# Patient Record
Sex: Female | Born: 1980 | Race: Black or African American | Hispanic: No | Smoking: Never smoker
Health system: Southern US, Community
[De-identification: ages and names within clinical notes are randomized; demographics above are authoritative.]

## PROBLEM LIST (undated history)

## (undated) DIAGNOSIS — J309 Allergic rhinitis, unspecified: Secondary | ICD-10-CM

## (undated) DIAGNOSIS — E539 Vitamin B deficiency, unspecified: Secondary | ICD-10-CM

## (undated) DIAGNOSIS — T7840XA Allergy, unspecified, initial encounter: Secondary | ICD-10-CM

## (undated) DIAGNOSIS — N938 Other specified abnormal uterine and vaginal bleeding: Secondary | ICD-10-CM

## (undated) DIAGNOSIS — R0683 Snoring: Secondary | ICD-10-CM

## (undated) DIAGNOSIS — E119 Type 2 diabetes mellitus without complications: Secondary | ICD-10-CM

## (undated) DIAGNOSIS — M519 Unspecified thoracic, thoracolumbar and lumbosacral intervertebral disc disorder: Secondary | ICD-10-CM

## (undated) DIAGNOSIS — I10 Essential (primary) hypertension: Secondary | ICD-10-CM

## (undated) DIAGNOSIS — K219 Gastro-esophageal reflux disease without esophagitis: Secondary | ICD-10-CM

## (undated) DIAGNOSIS — K649 Unspecified hemorrhoids: Secondary | ICD-10-CM

## (undated) DIAGNOSIS — N920 Excessive and frequent menstruation with regular cycle: Secondary | ICD-10-CM

## (undated) DIAGNOSIS — G629 Polyneuropathy, unspecified: Secondary | ICD-10-CM

## (undated) DIAGNOSIS — N750 Cyst of Bartholin's gland: Secondary | ICD-10-CM

## (undated) DIAGNOSIS — M21069 Valgus deformity, not elsewhere classified, unspecified knee: Secondary | ICD-10-CM

## (undated) DIAGNOSIS — K279 Peptic ulcer, site unspecified, unspecified as acute or chronic, without hemorrhage or perforation: Secondary | ICD-10-CM

## (undated) DIAGNOSIS — E559 Vitamin D deficiency, unspecified: Secondary | ICD-10-CM

## (undated) DIAGNOSIS — Z8659 Personal history of other mental and behavioral disorders: Secondary | ICD-10-CM

## (undated) DIAGNOSIS — R400 Somnolence: Secondary | ICD-10-CM

## (undated) HISTORY — PX: GALLBLADDER SURGERY: SHX652

## (undated) HISTORY — DX: Personal history of other mental and behavioral disorders: Z86.59

## (undated) HISTORY — DX: Allergic rhinitis, unspecified: J30.9

## (undated) HISTORY — PX: DIAGNOSTIC LAPAROSCOPY: SUR761

## (undated) HISTORY — DX: Polyneuropathy, unspecified: G62.9

## (undated) HISTORY — DX: Allergy, unspecified, initial encounter: T78.40XA

## (undated) HISTORY — DX: Type 2 diabetes mellitus without complications: E11.9

## (undated) HISTORY — DX: Unspecified hemorrhoids: K64.9

## (undated) HISTORY — DX: Somnolence: R40.0

## (undated) HISTORY — DX: Vitamin D deficiency, unspecified: E55.9

## (undated) HISTORY — DX: Vitamin B deficiency, unspecified: E53.9

## (undated) HISTORY — PX: ENDOMETRIAL BIOPSY: PRO73

## (undated) HISTORY — PX: TUBAL LIGATION: SHX77

## (undated) HISTORY — DX: Unspecified thoracic, thoracolumbar and lumbosacral intervertebral disc disorder: M51.9

## (undated) HISTORY — DX: Essential (primary) hypertension: I10

## (undated) HISTORY — PX: TONSILLECTOMY: SUR1361

## (undated) HISTORY — DX: Gastro-esophageal reflux disease without esophagitis: K21.9

## (undated) HISTORY — DX: Excessive and frequent menstruation with regular cycle: N92.0

## (undated) HISTORY — DX: Valgus deformity, not elsewhere classified, unspecified knee: M21.069

## (undated) HISTORY — DX: Peptic ulcer, site unspecified, unspecified as acute or chronic, without hemorrhage or perforation: K27.9

## (undated) HISTORY — DX: Cyst of Bartholin's gland: N75.0

## (undated) HISTORY — DX: Snoring: R06.83

## (undated) HISTORY — DX: Other specified abnormal uterine and vaginal bleeding: N93.8

## (undated) HISTORY — PX: SALPINGECTOMY: SHX328

---

## 2002-07-10 HISTORY — PX: DIAGNOSTIC LAPAROSCOPY: SUR761

## 2009-07-10 HISTORY — PX: TUBAL LIGATION: SHX77

## 2010-07-10 HISTORY — PX: OTHER SURGICAL HISTORY: SHX169

## 2014-07-10 HISTORY — PX: GALLBLADDER SURGERY: SHX652

## 2014-07-10 HISTORY — PX: TONSILLECTOMY: SUR1361

## 2017-10-18 DIAGNOSIS — K649 Unspecified hemorrhoids: Secondary | ICD-10-CM | POA: Insufficient documentation

## 2017-12-12 DIAGNOSIS — K219 Gastro-esophageal reflux disease without esophagitis: Secondary | ICD-10-CM | POA: Insufficient documentation

## 2017-12-12 DIAGNOSIS — R7989 Other specified abnormal findings of blood chemistry: Secondary | ICD-10-CM | POA: Insufficient documentation

## 2017-12-12 DIAGNOSIS — I10 Essential (primary) hypertension: Secondary | ICD-10-CM | POA: Insufficient documentation

## 2019-07-03 DIAGNOSIS — N750 Cyst of Bartholin's gland: Secondary | ICD-10-CM | POA: Insufficient documentation

## 2020-03-25 DIAGNOSIS — H5213 Myopia, bilateral: Secondary | ICD-10-CM | POA: Insufficient documentation

## 2021-06-27 ENCOUNTER — Ambulatory Visit: Payer: Self-pay | Admitting: Nurse Practitioner

## 2021-07-18 ENCOUNTER — Ambulatory Visit (INDEPENDENT_AMBULATORY_CARE_PROVIDER_SITE_OTHER): Payer: Medicaid Other | Admitting: Nurse Practitioner

## 2021-07-18 ENCOUNTER — Encounter: Payer: Self-pay | Admitting: Nurse Practitioner

## 2021-07-18 ENCOUNTER — Other Ambulatory Visit: Payer: Self-pay

## 2021-07-18 VITALS — BP 121/73 | HR 80 | Temp 98.0°F | Ht 65.0 in | Wt 246.4 lb

## 2021-07-18 DIAGNOSIS — E1142 Type 2 diabetes mellitus with diabetic polyneuropathy: Secondary | ICD-10-CM

## 2021-07-18 DIAGNOSIS — K279 Peptic ulcer, site unspecified, unspecified as acute or chronic, without hemorrhage or perforation: Secondary | ICD-10-CM

## 2021-07-18 DIAGNOSIS — F439 Reaction to severe stress, unspecified: Secondary | ICD-10-CM

## 2021-07-18 DIAGNOSIS — Z7689 Persons encountering health services in other specified circumstances: Secondary | ICD-10-CM | POA: Diagnosis not present

## 2021-07-18 DIAGNOSIS — Z6841 Body Mass Index (BMI) 40.0 and over, adult: Secondary | ICD-10-CM

## 2021-07-18 MED ORDER — PANTOPRAZOLE SODIUM 40 MG PO TBEC: 40.0000 mg | DELAYED_RELEASE_TABLET | Freq: Every day | ORAL | 1 refills | Status: DC

## 2021-07-18 NOTE — Progress Notes (Signed)
New Patient Office Visit  Subjective:  Patient ID: Allison Rose, female    DOB: 02/26/1981  Age: 41 y.o. MRN: [id number]  CC:  Chief Complaint  Patient presents with   New Patient (Initial Visit)    HPI Allison Rose presents to establish new primary care provider. She recently moved to the area from [place]. Move was work related.  She has history of hypertension. She is no longer taking her blood pressure medication and blood pressure is well controlled.  She takes victoza and pantoprazole and another medication for gastric ulcer.  She states that her neuropathy is getting worse. She feels like this is diabetic related. She feels like the last HgbA1c was around 6.4. she does not check her blood sugar. She was taking gabapentin at one point. Believes she was taking 600mg  daily. Stopped taking it over the summer sometime. She states that if neuropathy happens, starting in her head, will take her speech away and it will last for hours. If it starts In her feet, she will fall and have loss of balance and this will last for hours. Has seen neurologist for this in the past. Had nerve conduction study done.  She is due to have routine, fasting labs.  States that she does have difficulty sleeping. Most recent medication she was on, was hydroxyzine. States that this did help her fall asleep, but still gets up multiple times each night to urinate. States that this has been a problem for several years.  The patient states that she has been eating less recently, but gaining weight.  Recently had tyroid biopsy due to "multiple" nodules on the thyroid.  Last CPE was doe in June. Will need to get medical records from previous primary care provider.   History reviewed. No pertinent past medical history.  Past Surgical History:  Procedure Laterality Date   GALLBLADDER SURGERY N/A 2016   TONSILLECTOMY N/A 2016   TUBAL LIGATION N/A 2011   tubial ligation N/A 2012    Family History  Problem Relation Age  of Onset   Alcoholism Mother    Depression Mother    Depression Father    Alcoholism Father    Depression Maternal Grandmother    Cancer Maternal Grandmother    Diabetes Maternal Grandmother    Diabetes Maternal Grandfather    Depression Maternal Grandfather    Cancer Maternal Grandfather     Social History   Socioeconomic History   Marital status: Legally Separated    Spouse name: Not on file   Number of children: Not on file   Years of education: Not on file   Highest education level: Not on file  Occupational History   Not on file  Tobacco Use   Smoking status: Never   Smokeless tobacco: Never  Substance and Sexual Activity   Alcohol use: Yes   Drug use: Yes   Sexual activity: Not Currently  Other Topics Concern   Not on file  Social History Narrative   Not on file   Social Determinants of Health   Financial Resource Strain: Not on file  Food Insecurity: Not on file  Transportation Needs: Not on file  Physical Activity: Not on file  Stress: Not on file  Social Connections: Not on file  Intimate Partner Violence: Not on file    ROS Review of Systems  Constitutional:  Positive for fatigue. Negative for activity change, appetite change, chills and fever.  HENT:  Negative for congestion, postnasal drip, rhinorrhea, sinus pressure, sinus pain,  sneezing and sore throat.   Eyes: Negative.   Respiratory:  Negative for cough, chest tightness, shortness of breath and wheezing.   Cardiovascular:  Negative for chest pain and palpitations.  Gastrointestinal:  Negative for abdominal pain, constipation, diarrhea, nausea and vomiting.  Endocrine: Negative for cold intolerance, heat intolerance, polydipsia and polyuria.       Unsure how blood sugars are doing. Has historically been well managed.   Genitourinary:  Negative for dyspareunia, dysuria, flank pain, frequency and urgency.  Musculoskeletal:  Positive for arthralgias and myalgias. Negative for back pain.  Skin:   Negative for rash.  Allergic/Immunologic: Negative for environmental allergies.  Neurological:  Positive for numbness. Negative for dizziness, weakness and headaches.       Neuropathy in the lower legs and feet.   Hematological:  Negative for adenopathy.  Psychiatric/Behavioral:  The patient is nervous/anxious. The patient is not hyperactive.    Objective:   Today's Vitals   07/18/21 1445  BP: 121/73  Pulse: 80  Temp: 98 F (36.7 C)  SpO2: 98%  Weight: 246 lb 6.4 oz (111.8 kg)  Height: 5\' 5"  (1.651 m)   Body mass index is 41 kg/m.   Physical Exam Vitals and nursing note reviewed.  Constitutional:      Appearance: Normal appearance. She is well-developed.  HENT:     Head: Normocephalic and atraumatic.     Nose: Nose normal.     Mouth/Throat:     Mouth: Mucous membranes are moist.     Pharynx: Oropharynx is clear. No oropharyngeal exudate.  Eyes:     Conjunctiva/sclera: Conjunctivae normal.     Pupils: Pupils are equal, round, and reactive to light.  Cardiovascular:     Rate and Rhythm: Normal rate and regular rhythm.     Pulses: Normal pulses.     Heart sounds: Normal heart sounds.  Pulmonary:     Effort: Pulmonary effort is normal.     Breath sounds: Normal breath sounds.  Abdominal:     Palpations: Abdomen is soft.  Musculoskeletal:        General: Normal range of motion.     Cervical back: Normal range of motion and neck supple.  Lymphadenopathy:     Cervical: No cervical adenopathy.  Skin:    General: Skin is warm and dry.     Capillary Refill: Capillary refill takes less than 2 seconds.  Neurological:     General: No focal deficit present.     Mental Status: She is alert and oriented to person, place, and time.  Psychiatric:        Mood and Affect: Mood normal.        Behavior: Behavior normal.        Thought Content: Thought content normal.        Judgment: Judgment normal.    Assessment & Plan:  1. Encounter to establish care Appointment today to  establish new primary care provider. Will get medical records from previous providers to review and update patient chart.   2. Type 2 diabetes mellitus with polyneuropathy (HCC) Continue victoza as prescribed. Will check glucose and HgbA1c prior to next visit. Will adjust as indicated   3. Peptic ulcer Continue protonix as prescribed.  - pantoprazole (PROTONIX) 40 MG tablet; Take 1 tablet (40 mg total) by mouth daily.  Dispense: 90 tablet; Refill: 1  4. Situational stress Refer for counseling services.  - Ambulatory referral to Psychology  5. Body mass index (BMI) of 40.1-44.9 in adult [facility])  Discussed lowering calorie intake to 1500 calories per day and incorporating exercise into daily routine to help lose weight.    Problem List Items Addressed This Visit       Digestive   Peptic ulcer   Relevant Medications   pantoprazole (PROTONIX) 40 MG tablet     Endocrine   Type 2 diabetes mellitus with polyneuropathy (HCC)   Relevant Medications   liraglutide (VICTOZA) 18 MG/3ML SOPN     Other   Situational stress   Relevant Orders   Ambulatory referral to Psychology   Body mass index (BMI) of 40.1-44.9 in adult [facility])   Other Visit Diagnoses     Encounter to establish care    -  Primary       Outpatient Encounter Medications as of 07/18/2021  Medication Sig   liraglutide (VICTOZA) 18 MG/3ML SOPN Inject into the skin.   [DISCONTINUED] pantoprazole (PROTONIX) 40 MG tablet Take 1 tablet by mouth daily.   pantoprazole (PROTONIX) 40 MG tablet Take 1 tablet (40 mg total) by mouth daily.   No facility-administered encounter medications on file as of 07/18/2021.    Follow-up: Return in about 2 weeks (around 08/01/2021) for DM - next monsay, needs FBW with TSH and Free T4, b12, vitamin d, HgbA1c - see below.   Ronnell Freshwater, NP

## 2021-07-24 DIAGNOSIS — F439 Reaction to severe stress, unspecified: Secondary | ICD-10-CM | POA: Insufficient documentation

## 2021-07-24 DIAGNOSIS — E1142 Type 2 diabetes mellitus with diabetic polyneuropathy: Secondary | ICD-10-CM | POA: Insufficient documentation

## 2021-07-24 DIAGNOSIS — K279 Peptic ulcer, site unspecified, unspecified as acute or chronic, without hemorrhage or perforation: Secondary | ICD-10-CM | POA: Insufficient documentation

## 2021-07-24 DIAGNOSIS — Z6841 Body Mass Index (BMI) 40.0 and over, adult: Secondary | ICD-10-CM | POA: Insufficient documentation

## 2021-07-28 ENCOUNTER — Other Ambulatory Visit: Payer: Medicaid Other

## 2021-07-28 ENCOUNTER — Other Ambulatory Visit: Payer: Self-pay

## 2021-07-28 DIAGNOSIS — E539 Vitamin B deficiency, unspecified: Secondary | ICD-10-CM

## 2021-07-28 DIAGNOSIS — R5383 Other fatigue: Secondary | ICD-10-CM

## 2021-07-28 DIAGNOSIS — E569 Vitamin deficiency, unspecified: Secondary | ICD-10-CM

## 2021-07-28 DIAGNOSIS — Z0000 Encounter for general adult medical examination without abnormal findings: Secondary | ICD-10-CM

## 2021-07-29 LAB — CBC
Hematocrit: 38.4 % (ref 34.0–46.6)
Hemoglobin: 12.7 g/dL (ref 11.1–15.9)
MCH: 24.8 pg — ABNORMAL LOW (ref 26.6–33.0)
MCHC: 33.1 g/dL (ref 31.5–35.7)
MCV: 75 fL — ABNORMAL LOW (ref 79–97)
Platelets: 308 10*3/uL (ref 150–450)
RBC: 5.13 x10E6/uL (ref 3.77–5.28)
RDW: 15.3 % (ref 11.7–15.4)
WBC: 8 10*3/uL (ref 3.4–10.8)

## 2021-07-29 LAB — T4, FREE: Free T4: 1.18 ng/dL (ref 0.82–1.77)

## 2021-07-29 LAB — COMPREHENSIVE METABOLIC PANEL
ALT: 20 IU/L (ref 0–32)
AST: 22 IU/L (ref 0–40)
Albumin/Globulin Ratio: 1.5 (ref 1.2–2.2)
Albumin: 4 g/dL (ref 3.8–4.8)
Alkaline Phosphatase: 69 IU/L (ref 44–121)
BUN/Creatinine Ratio: 9 (ref 9–23)
BUN: 8 mg/dL (ref 6–20)
Bilirubin Total: 0.3 mg/dL (ref 0.0–1.2)
CO2: 22 mmol/L (ref 20–29)
Calcium: 9.2 mg/dL (ref 8.7–10.2)
Chloride: 106 mmol/L (ref 96–106)
Creatinine, Ser: 0.87 mg/dL (ref 0.57–1.00)
Globulin, Total: 2.7 g/dL (ref 1.5–4.5)
Glucose: 95 mg/dL (ref 70–99)
Potassium: 4.2 mmol/L (ref 3.5–5.2)
Sodium: 142 mmol/L (ref 134–144)
Total Protein: 6.7 g/dL (ref 6.0–8.5)
eGFR: 87 mL/min/{1.73_m2} (ref >59)

## 2021-07-29 LAB — LIPID PANEL
Chol/HDL Ratio: 3.3 ratio (ref 0.0–4.4)
Cholesterol, Total: 156 mg/dL (ref 100–199)
HDL: 48 mg/dL (ref >39)
LDL Chol Calc (NIH): 98 mg/dL (ref 0–99)
Triglycerides: 47 mg/dL (ref 0–149)
VLDL Cholesterol Cal: 10 mg/dL (ref 5–40)

## 2021-07-29 LAB — HEMOGLOBIN A1C
Est. average glucose Bld gHb Est-mCnc: 137 mg/dL
Hgb A1c MFr Bld: 6.4 % — ABNORMAL HIGH (ref 4.8–5.6)

## 2021-07-29 LAB — TSH: TSH: 0.938 u[IU]/mL (ref 0.450–4.500)

## 2021-07-29 LAB — VITAMIN D 25 HYDROXY (VIT D DEFICIENCY, FRACTURES): Vit D, 25-Hydroxy: 10.9 ng/mL — ABNORMAL LOW (ref 30.0–100.0)

## 2021-07-29 LAB — VITAMIN B12: Vitamin B-12: 537 pg/mL (ref 232–1245)

## 2021-08-01 ENCOUNTER — Encounter: Payer: Self-pay | Admitting: Nurse Practitioner

## 2021-08-01 ENCOUNTER — Ambulatory Visit (INDEPENDENT_AMBULATORY_CARE_PROVIDER_SITE_OTHER): Payer: Medicaid Other | Admitting: Nurse Practitioner

## 2021-08-01 ENCOUNTER — Other Ambulatory Visit: Payer: Self-pay

## 2021-08-01 VITALS — BP 117/75 | HR 79 | Temp 98.3°F | Ht 65.0 in | Wt 245.1 lb

## 2021-08-01 DIAGNOSIS — M5116 Intervertebral disc disorders with radiculopathy, lumbar region: Secondary | ICD-10-CM | POA: Diagnosis not present

## 2021-08-01 DIAGNOSIS — M21069 Valgus deformity, not elsewhere classified, unspecified knee: Secondary | ICD-10-CM | POA: Diagnosis not present

## 2021-08-01 DIAGNOSIS — E1142 Type 2 diabetes mellitus with diabetic polyneuropathy: Secondary | ICD-10-CM

## 2021-08-01 DIAGNOSIS — D485 Neoplasm of uncertain behavior of skin: Secondary | ICD-10-CM

## 2021-08-01 DIAGNOSIS — Z6841 Body Mass Index (BMI) 40.0 and over, adult: Secondary | ICD-10-CM

## 2021-08-01 DIAGNOSIS — M419 Scoliosis, unspecified: Secondary | ICD-10-CM

## 2021-08-01 DIAGNOSIS — E559 Vitamin D deficiency, unspecified: Secondary | ICD-10-CM | POA: Insufficient documentation

## 2021-08-01 DIAGNOSIS — E539 Vitamin B deficiency, unspecified: Secondary | ICD-10-CM

## 2021-08-01 LAB — POCT GLYCOSYLATED HEMOGLOBIN (HGB A1C): Hemoglobin A1C: 6.2 % — AB (ref 4.0–5.6)

## 2021-08-01 MED ORDER — ERGOCALCIFEROL 1.25 MG (50000 UT) PO CAPS: 50000.0000 [IU] | ORAL_CAPSULE | ORAL | 5 refills | Status: DC

## 2021-08-01 NOTE — Patient Instructions (Signed)
Fat and Cholesterol Restricted Eating Plan Getting too much fat and cholesterol in your diet may cause health problems. Choosing the right foods helps keep your fat and cholesterol at normal levels. This can keep you from getting certain diseases. Your doctor may recommend an eating plan that includes: Total fat: ______% or less of total calories a day. This is ______g of fat a day. Saturated fat: ______% or less of total calories a day. This is ______g of saturated fat a day. Cholesterol: less than _________mg a day. Fiber: ______g a day. What are tips for following this plan? General tips Work with your doctor to lose weight if you need to. Avoid: Foods with added sugar. Fried foods. Foods with trans fat or partially hydrogenated oils. This includes some margarines and baked goods. If you drink alcohol: Limit how much you have to: 0-1 drink a day for women who are not pregnant. 0-2 drinks a day for men. Know how much alcohol is in a drink. In the [place] one drink equals one 12 oz bottle of beer (355 mL), one 5 oz glass of wine (148 mL), or one 1 oz glass of hard liquor (44 mL). Reading food labels Check food labels for: Trans fats. Partially hydrogenated oils. Saturated fat (g) in each serving. Cholesterol (mg) in each serving. Fiber (g) in each serving. Choose foods with healthy fats, such as: Monounsaturated fats and polyunsaturated fats. These include olive and canola oil, flaxseeds, walnuts, almonds, and seeds. Omega-3 fats. These are found in certain fish, flaxseed oil, and ground flaxseeds. Choose grain products that have whole grains. Look for the word "whole" as the first word in the ingredient list. Cooking Cook foods using low-fat methods. These include baking, boiling, grilling, and broiling. Eat more home-cooked foods. Eat at restaurants and buffets less often. Eat less fast food. Avoid cooking using saturated fats, such as butter, cream, palm oil, palm kernel oil, and  coconut oil. Meal planning  At meals, divide your plate into four equal parts: Fill one-half of your plate with vegetables, green salads, and fruit. Fill one-fourth of your plate with whole grains. Fill one-fourth of your plate with low-fat (lean) protein foods. Eat fish that is high in omega-3 fats at least two times a week. This includes mackerel, tuna, sardines, and salmon. Eat foods that are high in fiber, such as whole grains, beans, apples, pears, berries, broccoli, carrots, peas, and barley. What foods should I eat? Fruits All fresh, canned (in natural juice), or frozen fruits. Vegetables Fresh or frozen vegetables (raw, steamed, roasted, or grilled). Green salads. Grains Whole grains, such as whole wheat or whole grain breads, crackers, cereals, and pasta. Unsweetened oatmeal, bulgur, barley, quinoa, or brown rice. Corn or whole wheat flour tortillas. Meats and other protein foods Ground beef (85% or leaner), grass-fed beef, or beef trimmed of fat. Skinless chicken or turkey. Ground chicken or turkey. Pork trimmed of fat. All fish and seafood. Egg whites. Dried beans, peas, or lentils. Unsalted nuts or seeds. Unsalted canned beans. Nut butters without added sugar or oil. Dairy Low-fat or nonfat dairy products, such as skim or 1% milk, 2% or reduced-fat cheeses, low-fat and fat-free ricotta or cottage cheese, or plain low-fat and nonfat yogurt. Fats and oils Tub margarine without trans fats. Light or reduced-fat mayonnaise and salad dressings. Avocado. Olive, canola, sesame, or safflower oils. The items listed above may not be a complete list of foods and beverages you can eat. Contact a dietitian for more information. What foods   should I avoid? Fruits Canned fruit in heavy syrup. Fruit in cream or butter sauce. Fried fruit. Vegetables Vegetables cooked in cheese, cream, or butter sauce. Fried vegetables. Grains White bread. White pasta. White rice. Cornbread. Bagels, pastries,  and croissants. Crackers and snack foods that contain trans fat and hydrogenated oils. Meats and other protein foods Fatty cuts of meat. Ribs, chicken wings, bacon, sausage, bologna, salami, chitterlings, fatback, hot dogs, bratwurst, and packaged lunch meats. Liver and organ meats. Whole eggs and egg yolks. Chicken and turkey with skin. Fried meat. Dairy Whole or 2% milk, cream, half-and-half, and cream cheese. Whole milk cheeses. Whole-fat or sweetened yogurt. Full-fat cheeses. Nondairy creamers and whipped toppings. Processed cheese, cheese spreads, and cheese curds. Fats and oils Butter, stick margarine, lard, shortening, ghee, or bacon fat. Coconut, palm kernel, and palm oils. Beverages Alcohol. Sugar-sweetened drinks such as sodas, lemonade, and fruit drinks. Sweets and desserts Corn syrup, sugars, honey, and molasses. Candy. Jam and jelly. Syrup. Sweetened cereals. Cookies, pies, cakes, donuts, muffins, and ice cream. The items listed above may not be a complete list of foods and beverages you should avoid. Contact a dietitian for more information. Summary Choosing the right foods helps keep your fat and cholesterol at normal levels. This can keep you from getting certain diseases. At meals, fill one-half of your plate with vegetables, green salads, and fruits. Eat high fiber foods, like whole grains, beans, apples, pears, berries, carrots, peas, and barley. Limit added sugar, saturated fats, alcohol, and fried foods. This information is not intended to replace advice given to you by your health care provider. Make sure you discuss any questions you have with your health care provider. Document Revised: 11/05/2020 Document Reviewed: 11/05/2020 Elsevier Patient Education  2022 Elsevier Inc.  

## 2021-08-01 NOTE — Progress Notes (Signed)
Established Patient Office Visit  Subjective:  Patient ID: Allison Rose, female    DOB: [birth year]  Age: 41 y.o. MRN: [id number]  CC:  Chief Complaint  Patient presents with   Diabetes    HPI Allison Rose presents for follow up visit. Recently moved from [place]. Had fasting blood work done prior to this visit. In generally, labs were good. Vitamin d was moderately low. Her HgbA1c is 6.4 today. She is supposed to be taking Victoza, but has not been taking this for some time.  -back pain. Has two areas of scoliosis in spine for which she was seeing a physical therapist and orthopedics for in [place]. She would like to have referral for those today  She is "knowck kneed." Feels as though left knee is turning in more than the right. Starting to interfere with her gait. May be causing her back to hurt more severely.  -has mole-like lesions which are growing on the inner aspects of both ankles. Are itchy. She states that when they are itchy is when they typically grow. She has noted two new ones on the puter aspec of the right foot.  History reviewed. No pertinent past medical history. -she did get referral to counseling services at her new patient visit. She states that this referral has already expired.  Past Surgical History:  Procedure Laterality Date   GALLBLADDER SURGERY N/A 2016   TONSILLECTOMY N/A 2016   TUBAL LIGATION N/A 2011   tubial ligation N/A 2012    Family History  Problem Relation Age of Onset   Alcoholism Mother    Depression Mother    Depression Father    Alcoholism Father    Depression Maternal Grandmother    Cancer Maternal Grandmother    Diabetes Maternal Grandmother    Diabetes Maternal Grandfather    Depression Maternal Grandfather    Cancer Maternal Grandfather     Social History   Socioeconomic History   Marital status: Legally Separated    Spouse name: Not on file   Number of children: Not on file   Years of education: Not on file   Highest  education level: Not on file  Occupational History   Not on file  Tobacco Use   Smoking status: Never   Smokeless tobacco: Never  Substance and Sexual Activity   Alcohol use: Yes   Drug use: Yes   Sexual activity: Not Currently  Other Topics Concern   Not on file  Social History Narrative   Not on file   Social Determinants of Health   Financial Resource Strain: Not on file  Food Insecurity: Not on file  Transportation Needs: Not on file  Physical Activity: Not on file  Stress: Not on file  Social Connections: Not on file  Intimate Partner Violence: Not on file    Outpatient Medications Prior to Visit  Medication Sig Dispense Refill   liraglutide (VICTOZA) 18 MG/3ML SOPN Inject into the skin.     pantoprazole (PROTONIX) 40 MG tablet Take 1 tablet (40 mg total) by mouth daily. 90 tablet 1   No facility-administered medications prior to visit.    Allergies  Allergen Reactions   Metformin Other (See Comments)    Stomach upset    ROS Review of Systems  Constitutional:  Negative for activity change, appetite change, chills, fatigue and fever.  HENT:  Negative for congestion, postnasal drip, rhinorrhea, sinus pressure, sinus pain, sneezing and sore throat.   Eyes: Negative.   Respiratory:  Negative for cough,  chest tightness, shortness of breath and wheezing.   Cardiovascular:  Negative for chest pain and palpitations.  Gastrointestinal:  Negative for abdominal pain, constipation, diarrhea, nausea and vomiting.  Endocrine: Negative for cold intolerance, heat intolerance, polydipsia and polyuria.       Blood sugars doing well    Genitourinary:  Negative for dyspareunia, dysuria, flank pain, frequency and urgency.  Musculoskeletal:  Positive for arthralgias, back pain and myalgias.  Skin:  Negative for rash.       Mole growth on inner ankles of both feet. Two new ones grwoing on outer aspect of right foot. Itchy and getting larger.   Allergic/Immunologic: Negative for  environmental allergies.  Neurological:  Negative for dizziness, weakness and headaches.  Hematological:  Negative for adenopathy.  Psychiatric/Behavioral:  Positive for dysphoric mood. The patient is nervous/anxious.      Objective:    Physical Exam Vitals and nursing note reviewed.  Constitutional:      Appearance: Normal appearance. She is well-developed. She is obese.  HENT:     Head: Normocephalic and atraumatic.     Nose: Nose normal.     Mouth/Throat:     Mouth: Mucous membranes are moist.  Eyes:     Extraocular Movements: Extraocular movements intact.     Conjunctiva/sclera: Conjunctivae normal.     Pupils: Pupils are equal, round, and reactive to light.  Cardiovascular:     Rate and Rhythm: Normal rate and regular rhythm.     Pulses: Normal pulses.     Heart sounds: Normal heart sounds.  Pulmonary:     Effort: Pulmonary effort is normal.     Breath sounds: Normal breath sounds.  Abdominal:     Palpations: Abdomen is soft.  Musculoskeletal:        General: Normal range of motion.     Cervical back: Normal range of motion and neck supple.       Feet:     Comments: The patient has moderate back pain.  This is worse with bending and twisting at the waist.  Standing up from a seated position causes increased pain. There is inward rotation of both knees.  Right knee is more severe than left.  Lymphadenopathy:     Cervical: No cervical adenopathy.  Skin:    General: Skin is warm and dry.     Capillary Refill: Capillary refill takes less than 2 seconds.  Neurological:     General: No focal deficit present.     Mental Status: She is alert and oriented to person, place, and time.  Psychiatric:        Mood and Affect: Mood normal.        Behavior: Behavior normal.        Thought Content: Thought content normal.        Judgment: Judgment normal.    Today's Vitals   08/01/21 0927  BP: 117/75  Pulse: 79  Temp: 98.3 F (36.8 C)  SpO2: 100%  Weight: 245 lb 1.9 oz  (111.2 kg)  Height: $Remove'5\' 5"'pBUvpWE$  (1.651 m)   Body mass index is 40.79 kg/m.    Wt Readings from Last 3 Encounters:  08/01/21 245 lb 1.9 oz (111.2 kg)  07/18/21 246 lb 6.4 oz (111.8 kg)     Health Maintenance Due  Topic Date Due   FOOT EXAM  Never done   OPHTHALMOLOGY EXAM  Never done   URINE MICROALBUMIN  Never done   HIV Screening  Never done   Hepatitis C Screening  Never done   TETANUS/TDAP  Never done   PAP SMEAR-Modifier  Never done   COVID-19 Vaccine (2 - [facility] risk series) 10/18/2019   INFLUENZA VACCINE  02/07/2021    There are no preventive care reminders to display for this patient.  Lab Results  Component Value Date   TSH 0.938 07/28/2021   Lab Results  Component Value Date   WBC 8.0 07/28/2021   HGB 12.7 07/28/2021   HCT 38.4 07/28/2021   MCV 75 (L) 07/28/2021   PLT 308 07/28/2021   Lab Results  Component Value Date   NA 142 07/28/2021   K 4.2 07/28/2021   CO2 22 07/28/2021   GLUCOSE 95 07/28/2021   BUN 8 07/28/2021   CREATININE 0.87 07/28/2021   BILITOT 0.3 07/28/2021   ALKPHOS 69 07/28/2021   AST 22 07/28/2021   ALT 20 07/28/2021   PROT 6.7 07/28/2021   ALBUMIN 4.0 07/28/2021   CALCIUM 9.2 07/28/2021   EGFR 87 07/28/2021   Lab Results  Component Value Date   CHOL 156 07/28/2021   Lab Results  Component Value Date   HDL 48 07/28/2021   Lab Results  Component Value Date   LDLCALC 98 07/28/2021   Lab Results  Component Value Date   TRIG 47 07/28/2021   Lab Results  Component Value Date   CHOLHDL 3.3 07/28/2021   Lab Results  Component Value Date   HGBA1C 6.2 (A) 08/01/2021      Assessment & Plan:  1. Type 2 diabetes mellitus with polyneuropathy (HCC) Hemoglobin A1c is 6.2 today.  Patient should continue to monitor blood sugars on a daily basis.  Blood sugar should be fasting.  The goal is to have been between 70 and 100. - POCT glycosylated hemoglobin (Hb A1C)  2. Lumbar disc disease with radiculopathy Patient with moderate  back pain, mostly in the.  It does radiate down both legs.  Will refer to orthopedics for further evaluation. - Ambulatory referral to Orthopedic Surgery  3. Scoliosis of lumbosacral spine, unspecified scoliosis type She states she has history of scoliosis in 2 separate areas of the spine.  Physical therapy has helped in the past.  Will refer to orthopedics and to physical therapy for further evaluation. - Ambulatory referral to Orthopedic Surgery - Ambulatory referral to Physical Therapy  4. Knock knee, unspecified laterality Patient with irritation of both knees, worse on the right than the left.  Refer to orthopedics for further evaluation. - Ambulatory referral to Orthopedic Surgery  5. 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  Refer to podiatry for further evaluation. - Ambulatory referral to Podiatry  6. Vitamin D deficiency Treat with Drisdol 50,000 international units once weekly for next 6 months. - ergocalciferol (DRISDOL) 1.25 MG (50000 UT) capsule; Take 1 capsule (50,000 Units total) by mouth once a week.  Dispense: 4 capsule; Refill: 5  7. Body mass index (BMI) of 40.1-44.9 in adult [facility]) Discussed lowering calorie intake to 1500 calories per day and incorporating exercise into daily routine to help lose weight.    Problem List Items Addressed This Visit       Endocrine   Type 2 diabetes mellitus with polyneuropathy ([place]) - Primary   Relevant Orders   POCT glycosylated hemoglobin (Hb A1C) (Completed)     Nervous and Auditory   Lumbar disc disease with radiculopathy   Relevant Orders   Ambulatory referral to Orthopedic Surgery     Musculoskeletal  and Integument   Scoliosis of lumbosacral spine   Relevant Orders   Ambulatory referral to Orthopedic Surgery   Ambulatory referral to Physical Therapy   Neoplasm of uncertain behavior of skin of foot   Relevant Orders   Ambulatory referral to Podiatry     Other    Body mass index (BMI) of 40.1-44.9 in adult [facility])   Knock knee   Relevant Orders   Ambulatory referral to Orthopedic Surgery   Vitamin D deficiency   Vitamin B deficiency   Relevant Medications   ergocalciferol (DRISDOL) 1.25 MG (50000 UT) capsule    Meds ordered this encounter  Medications   ergocalciferol (DRISDOL) 1.25 MG (50000 UT) capsule    Sig: Take 1 capsule (50,000 Units total) by mouth once a week.    Dispense:  4 capsule    Refill:  5    Order Specific Question:   Supervising Provider    Answer:   Beatrice Lecher D [[id number]    Follow-up: Return in about 3 months (around 10/30/2021) for diabetes with HgbA1c check.    Ronnell Freshwater, NP  This note was dictated using Systems analyst. Rapid proofreading was performed to expedite the delivery of the information. Despite proofreading, phonetic errors will occur which are common with this voice recognition software. Please take this into consideration. If there are any concerns, please contact our office.

## 2021-08-01 NOTE — Progress Notes (Signed)
Reviewed with patient during visit. Started on drisdol

## 2021-08-06 ENCOUNTER — Encounter: Payer: Self-pay | Admitting: Emergency Medicine

## 2021-08-06 ENCOUNTER — Other Ambulatory Visit: Payer: Self-pay

## 2021-08-06 ENCOUNTER — Ambulatory Visit
Admission: EM | Admit: 2021-08-06 | Discharge: 2021-08-06 | Disposition: A | Discharge status: Home / Self Care | Payer: No Typology Code available for payment source | Attending: Emergency Medicine | Admitting: Emergency Medicine

## 2021-08-06 DIAGNOSIS — S8010XA Contusion of unspecified lower leg, initial encounter: Secondary | ICD-10-CM

## 2021-08-06 DIAGNOSIS — S80811A Abrasion, right lower leg, initial encounter: Secondary | ICD-10-CM | POA: Diagnosis not present

## 2021-08-06 DIAGNOSIS — Z23 Encounter for immunization: Secondary | ICD-10-CM | POA: Diagnosis not present

## 2021-08-06 MED ORDER — MUPIROCIN 2 % EX OINT: 1.0000 "application " | TOPICAL_OINTMENT | Freq: Two times a day (BID) | CUTANEOUS | 0 refills | Status: DC

## 2021-08-06 MED ORDER — TETANUS-DIPHTH-ACELL PERTUSSIS 5-2.5-18.5 LF-MCG/0.5 IM SUSY
0.5000 mL | PREFILLED_SYRINGE | Freq: Once | INTRAMUSCULAR | Status: AC
  Administered 2021-08-06: 0.5 mL via INTRAMUSCULAR

## 2021-08-06 NOTE — ED Provider Notes (Signed)
[name]    CSN: [id number] Arrival date & time: 08/06/21  9767      History   Chief Complaint Chief Complaint  Patient presents with   Fall   Leg Pain    HPI Allison Rose is a 41 y.o. female.  Patient presents with bruises on both of her anterior lower legs and abrasion on her right anterior lower leg since yesterday.  She was carrying 2 boxes at work yesterday; tripped and fell over a cart.  No head injury or loss of consciousness.  No other injuries. No numbness, weakness, paresthesias, wound drainage, fever, or other symptoms.  No treatment at home. Her medical history includes diabetes, polyneuropathy, peptic ulcer, lumbar disc disease with radiculopathy, scoliosis.   The history is provided by the patient and medical records.   History reviewed. No pertinent past medical history.  Patient Active Problem List   Diagnosis Date Noted   Lumbar disc disease with radiculopathy 08/01/2021   Scoliosis of lumbosacral spine 08/01/2021   Knock knee 08/01/2021   Neoplasm of uncertain behavior of skin of foot 08/01/2021   Vitamin D deficiency 08/01/2021   Vitamin B deficiency 08/01/2021   Type 2 diabetes mellitus with polyneuropathy ([place]) 07/24/2021   Peptic ulcer 07/24/2021   Situational stress 07/24/2021   Body mass index (BMI) of 40.1-44.9 in adult [facility]) 07/24/2021    Past Surgical History:  Procedure Laterality Date   GALLBLADDER SURGERY N/A 2016   TONSILLECTOMY N/A 2016   TUBAL LIGATION N/A 2011   tubial ligation N/A 2012    OB History   No obstetric history on file.      Home Medications    Prior to Admission medications   Medication Sig Start Date End Date Taking? Authorizing Provider  mupirocin ointment (BACTROBAN) 2 % Apply 1 application topically 2 (two) times daily. 08/06/21  Yes Sharion Balloon, NP  ergocalciferol (DRISDOL) 1.25 MG (50000 UT) capsule Take 1 capsule (50,000 Units total) by mouth once a week. 08/01/21   Ronnell Freshwater, NP   liraglutide (VICTOZA) 18 MG/3ML SOPN Inject into the skin.    [provider]  pantoprazole (PROTONIX) 40 MG tablet Take 1 tablet (40 mg total) by mouth daily. 07/18/21   Ronnell Freshwater, NP    Family History Family History  Problem Relation Age of Onset   Alcoholism Mother    Depression Mother    Depression Father    Alcoholism Father    Depression Maternal Grandmother    Cancer Maternal Grandmother    Diabetes Maternal Grandmother    Diabetes Maternal Grandfather    Depression Maternal Grandfather    Cancer Maternal Grandfather     Social History Social History   Tobacco Use   Smoking status: Never   Smokeless tobacco: Never  Substance Use Topics   Alcohol use: Yes   Drug use: Yes     Allergies   Metformin   Review of Systems Review of Systems  Constitutional:  Negative for chills and fever.  Respiratory:  Negative for cough and shortness of breath.   Cardiovascular:  Negative for chest pain and palpitations.  Gastrointestinal:  Negative for abdominal pain.  Musculoskeletal:  Negative for arthralgias, back pain, gait problem and joint swelling.  Skin:  Positive for color change and wound.  Neurological:  Negative for weakness and numbness.  All other systems reviewed and are negative.   Physical Exam Triage Vital Signs ED Triage Vitals  Enc Vitals Group  BP 08/06/21 1001 (!) 150/87     Pulse Rate 08/06/21 1001 89     Resp 08/06/21 1001 18     Temp 08/06/21 1001 98.6 F (37 C)     Temp src --      SpO2 08/06/21 1001 97 %     Weight --      Height --      Head Circumference --      Peak Flow --      Pain Score 08/06/21 1014 4     Pain Loc --      Pain Edu? --      Excl. in [place]? --    No data found.  Updated Vital Signs BP (!) 150/87 (BP Location: Left Arm)    Pulse 89    Temp 98.6 F (37 C)    Resp 18    LMP 07/16/2021    SpO2 97%   Visual Acuity Right Eye Distance:   Left Eye Distance:   Bilateral Distance:    Right Eye  Near:   Left Eye Near:    Bilateral Near:     Physical Exam Vitals and nursing note reviewed.  Constitutional:      General: She is not in acute distress.    Appearance: She is well-developed. She is not ill-appearing.  HENT:     Mouth/Throat:     Mouth: Mucous membranes are moist.  Cardiovascular:     Rate and Rhythm: Normal rate and regular rhythm.     Heart sounds: Normal heart sounds.  Pulmonary:     Effort: Pulmonary effort is normal. No respiratory distress.     Breath sounds: Normal breath sounds.  Musculoskeletal:        General: Tenderness present. No swelling or deformity. Normal range of motion.     Cervical back: Neck supple.       Legs:  Skin:    General: Skin is warm and dry.     Capillary Refill: Capillary refill takes less than 2 seconds.     Findings: Bruising and lesion present. No erythema.  Neurological:     General: No focal deficit present.     Mental Status: She is alert and oriented to person, place, and time.     Sensory: No sensory deficit.     Motor: No weakness.     Gait: Gait normal.  Psychiatric:        Mood and Affect: Mood normal.        Behavior: Behavior normal.     UC Treatments / Results  Labs (all labs ordered are listed, but only abnormal results are displayed) Labs Reviewed - No data to display  EKG   Radiology No results found.  Procedures Procedures (including critical care time)  Medications Ordered in UC Medications  Tdap (BOOSTRIX) injection 0.5 mL (0.5 mLs Intramuscular Given 08/06/21 1020)    Initial Impression / Assessment and Plan / UC Course  I have reviewed the triage vital signs and the nursing notes.  Pertinent labs & imaging results that were available during my care of the patient were reviewed by me and considered in my medical decision making (see chart for details).   Abrasion of right lower leg, Contusions of bilateral lower legs.  Tetanus updated.  Patient declines xrays.  Treating abrasion with  mupirocin ointment. Wound care instructions and signs of infection discussed.  Discussed care of contusions.  Instructed her to follow up with her PCP if her symptoms are not  improving.  She agrees to plan of care.    Final Clinical Impressions(s) / UC Diagnoses   Final diagnoses:  Abrasion of right lower leg, initial encounter  Contusion of lower leg, unspecified laterality, initial encounter     Discharge Instructions      Your tetanus was updated today.  Apply the mupirocin ointment as directed.  Follow up with your primary care provider if your symptoms are not improving.         ED Prescriptions     Medication Sig Dispense Auth. Provider   mupirocin ointment (BACTROBAN) 2 % Apply 1 application topically 2 (two) times daily. 22 g Sharion Balloon, NP      PDMP not reviewed this encounter.Sharion Balloon, NP 08/06/21 [phone]

## 2021-08-06 NOTE — ED Triage Notes (Signed)
Pt here for work injury that occurred yesterday. Pt had a mechanical fall while holding boxes and hurting both shins. Bruises to both legs, and a small scratch to right leg. Pt needs an updated tetanus.

## 2021-08-06 NOTE — Discharge Instructions (Addendum)
Your tetanus was updated today.  Apply the mupirocin ointment as directed.  Follow up with your primary care provider if your symptoms are not improving.

## 2021-08-07 ENCOUNTER — Encounter: Payer: Self-pay | Admitting: Emergency Medicine

## 2021-08-07 ENCOUNTER — Ambulatory Visit
Admission: EM | Admit: 2021-08-07 | Discharge: 2021-08-07 | Disposition: A | Discharge status: Home / Self Care | Payer: Medicaid Other | Attending: Emergency Medicine | Admitting: Emergency Medicine

## 2021-08-07 DIAGNOSIS — H9201 Otalgia, right ear: Secondary | ICD-10-CM

## 2021-08-07 DIAGNOSIS — J029 Acute pharyngitis, unspecified: Secondary | ICD-10-CM | POA: Diagnosis not present

## 2021-08-07 DIAGNOSIS — H6981 Other specified disorders of Eustachian tube, right ear: Secondary | ICD-10-CM

## 2021-08-07 LAB — POCT RAPID STREP A (OFFICE): Rapid Strep A Screen: NEGATIVE

## 2021-08-07 NOTE — ED Provider Notes (Signed)
[name]    CSN: [id number] Arrival date & time: 08/07/21  0907      History   Chief Complaint Chief Complaint  Patient presents with   Otalgia   Facial Pain    HPI Allison Rose is a 41 y.o. female.  Patient was seen here yesterday for a work-related lower leg injury.  She developed right ear pain and right side sore throat yesterday afternoon.  Treatment at home with ibuprofen.  No fever, chills, cough, shortness of breath, vomiting, diarrhea, or other symptoms.  Her medical history includes diabetes.    The history is provided by the patient and medical records.   History reviewed. No pertinent past medical history.  Patient Active Problem List   Diagnosis Date Noted   Lumbar disc disease with radiculopathy 08/01/2021   Scoliosis of lumbosacral spine 08/01/2021   Knock knee 08/01/2021   Neoplasm of uncertain behavior of skin of foot 08/01/2021   Vitamin D deficiency 08/01/2021   Vitamin B deficiency 08/01/2021   Type 2 diabetes mellitus with polyneuropathy ([place]) 07/24/2021   Peptic ulcer 07/24/2021   Situational stress 07/24/2021   Body mass index (BMI) of 40.1-44.9 in adult [facility]) 07/24/2021    Past Surgical History:  Procedure Laterality Date   GALLBLADDER SURGERY N/A 2016   TONSILLECTOMY N/A 2016   TUBAL LIGATION N/A 2011   tubial ligation N/A 2012    OB History   No obstetric history on file.      Home Medications    Prior to Admission medications   Medication Sig Start Date End Date Taking? Authorizing Provider  ergocalciferol (DRISDOL) 1.25 MG (50000 UT) capsule Take 1 capsule (50,000 Units total) by mouth once a week. 08/01/21   Ronnell Freshwater, NP  liraglutide (VICTOZA) 18 MG/3ML SOPN Inject into the skin.    [provider]  mupirocin ointment (BACTROBAN) 2 % Apply 1 application topically 2 (two) times daily. 08/06/21   Sharion Balloon, NP  pantoprazole (PROTONIX) 40 MG tablet Take 1 tablet (40 mg total) by mouth daily. 07/18/21    Ronnell Freshwater, NP    Family History Family History  Problem Relation Age of Onset   Alcoholism Mother    Depression Mother    Depression Father    Alcoholism Father    Depression Maternal Grandmother    Cancer Maternal Grandmother    Diabetes Maternal Grandmother    Diabetes Maternal Grandfather    Depression Maternal Grandfather    Cancer Maternal Grandfather     Social History Social History   Tobacco Use   Smoking status: Never   Smokeless tobacco: Never  Substance Use Topics   Alcohol use: Yes   Drug use: Yes     Allergies   Metformin   Review of Systems Review of Systems  Constitutional:  Negative for chills and fever.  HENT:  Positive for ear pain and sore throat.   Respiratory:  Negative for cough and shortness of breath.   Cardiovascular:  Negative for chest pain and palpitations.  Gastrointestinal:  Negative for diarrhea and vomiting.  Skin:  Negative for color change and rash.  All other systems reviewed and are negative.   Physical Exam Triage Vital Signs ED Triage Vitals [08/07/21 0912]  Enc Vitals Group     BP      Pulse      Resp      Temp      Temp src      SpO2  Weight      Height      Head Circumference      Peak Flow      Pain Score 5     Pain Loc      Pain Edu?      Excl. in [place]?    No data found.  Updated Vital Signs BP 132/85 (BP Location: Left Arm)    Pulse 94    Temp 98.2 F (36.8 C) (Oral)    Resp 18    LMP 07/16/2021    SpO2 98%   Visual Acuity Right Eye Distance:   Left Eye Distance:   Bilateral Distance:    Right Eye Near:   Left Eye Near:    Bilateral Near:     Physical Exam Vitals and nursing note reviewed.  Constitutional:      General: She is not in acute distress.    Appearance: She is well-developed. She is not ill-appearing.  HENT:     Right Ear: Tympanic membrane and ear canal normal.     Left Ear: Tympanic membrane and ear canal normal.     Nose: Nose normal.     Mouth/Throat:      Mouth: Mucous membranes are moist.     Pharynx: Posterior oropharyngeal erythema present.  Cardiovascular:     Rate and Rhythm: Normal rate and regular rhythm.     Heart sounds: Normal heart sounds.  Pulmonary:     Effort: Pulmonary effort is normal. No respiratory distress.     Breath sounds: Normal breath sounds.  Musculoskeletal:     Cervical back: Neck supple.  Skin:    General: Skin is warm and dry.  Neurological:     Mental Status: She is alert.  Psychiatric:        Mood and Affect: Mood normal.        Behavior: Behavior normal.     UC Treatments / Results  Labs (all labs ordered are listed, but only abnormal results are displayed) Labs Reviewed  COVID-19, FLU A+B NAA  POCT RAPID STREP A (OFFICE)    EKG   Radiology No results found.  Procedures Procedures (including critical care time)  Medications Ordered in UC Medications - No data to display  Initial Impression / Assessment and Plan / UC Course  I have reviewed the triage vital signs and the nursing notes.  Pertinent labs & imaging results that were available during my care of the patient were reviewed by me and considered in my medical decision making (see chart for details).    Sore throat, right ear otalgia and eustachian tube dysfunction.  Rapid strep negative.  COVID and flu pending.  Instructed patient to self quarantine per CDC guidelines.  Discussed symptomatic treatment including plain Mucinex, ibuprofen, rest, hydration.  Instructed patient to follow up with PCP if symptoms are not improving.  Patient agrees to plan of care.   Final Clinical Impressions(s) / UC Diagnoses   Final diagnoses:  Sore throat  Otalgia of right ear  Dysfunction of right eustachian tube     Discharge Instructions      Your rapid strep test is negative.  Your COVID and Flu tests are pending.  You should self quarantine until the test results are back.    Take plain Mucinex and ibuprofen as directed.      Follow-up with your primary care provider if your symptoms are not improving.         ED Prescriptions   None  PDMP not reviewed this encounter.   Sharion Balloon, NP 08/07/21 [phone]

## 2021-08-07 NOTE — ED Triage Notes (Signed)
Pt here with right ear pain and right facial/jaw pain x 1 day.

## 2021-08-07 NOTE — Discharge Instructions (Addendum)
Your rapid strep test is negative.  Your COVID and Flu tests are pending.  You should self quarantine until the test results are back.    Take plain Mucinex and ibuprofen as directed.     Follow-up with your primary care provider if your symptoms are not improving.

## 2021-08-08 ENCOUNTER — Ambulatory Visit (HOSPITAL_BASED_OUTPATIENT_CLINIC_OR_DEPARTMENT_OTHER)
Admission: RE | Admit: 2021-08-08 | Discharge: 2021-08-08 | Disposition: A | Discharge status: Home / Self Care | Payer: Medicaid Other | Source: Ambulatory Visit | Attending: Orthopaedic Surgery | Admitting: Orthopaedic Surgery

## 2021-08-08 ENCOUNTER — Other Ambulatory Visit: Payer: Self-pay

## 2021-08-08 ENCOUNTER — Ambulatory Visit (INDEPENDENT_AMBULATORY_CARE_PROVIDER_SITE_OTHER): Payer: Medicaid Other | Admitting: Orthopaedic Surgery

## 2021-08-08 ENCOUNTER — Other Ambulatory Visit (HOSPITAL_BASED_OUTPATIENT_CLINIC_OR_DEPARTMENT_OTHER): Payer: Self-pay | Admitting: Orthopaedic Surgery

## 2021-08-08 DIAGNOSIS — G8929 Other chronic pain: Secondary | ICD-10-CM | POA: Diagnosis present

## 2021-08-08 DIAGNOSIS — M545 Low back pain, unspecified: Secondary | ICD-10-CM | POA: Diagnosis present

## 2021-08-08 DIAGNOSIS — M25561 Pain in right knee: Secondary | ICD-10-CM | POA: Insufficient documentation

## 2021-08-08 LAB — COVID-19, FLU A+B NAA
Influenza A, NAA: NOT DETECTED
Influenza B, NAA: NOT DETECTED
SARS-CoV-2, NAA: NOT DETECTED

## 2021-08-08 NOTE — Progress Notes (Signed)
Chief Complaint: Low back pain and right knee pain     History of Present Illness:    Allison Rose is a 41 y.o. female presents with low back pain and right knee pain which has been going on for several years.  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  She was previously very active in [place] although has been less active at this time.  She does have a history of diabetic neuropathy.  She states that she has gained some weight since moved to [place] which she is happy with.  She is also not been able to do her exercises for her back.  She says that previously working on her back strengthening and swimming did not make her back feel dramatically better.  She has tried bracing as well as ergonomic adjustments for the truck.  She takes Tylenol and ibuprofen as needed for the back.  She denies any specific knee injury.  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  She has been wearing an over-the-counter brace for the right knee but this is only somewhat helpful.  She is never had injections or physical therapy for the right knee.    Surgical History:   None  PMH/PSH/Family History/Social History/Meds/Allergies:   No past medical history on file. Past Surgical History:  Procedure Laterality Date   GALLBLADDER SURGERY N/A 2016   TONSILLECTOMY N/A 2016   TUBAL LIGATION N/A 2011   tubial ligation N/A 2012   Social History   Socioeconomic History   Marital status: Legally Separated    Spouse name: Not on file   Number of children: Not on file   Years of education: Not on file   Highest education level: Not on file  Occupational History   Not on file  Tobacco Use   Smoking status: Never   Smokeless tobacco: Never  Substance and Sexual Activity   Alcohol use: Yes   Drug use: Yes   Sexual activity: Not Currently  Other Topics Concern   Not on file  Social History Narrative   Not on file    Social Determinants of Health   Financial Resource Strain: Not on file  Food Insecurity: Not on file  Transportation Needs: Not on file  Physical Activity: Not on file  Stress: Not on file  Social Connections: Not on file   Family History  Problem Relation Age of Onset   Alcoholism Mother    Depression Mother    Depression Father    Alcoholism Father    Depression Maternal Grandmother    Cancer Maternal Grandmother    Diabetes Maternal Grandmother    Diabetes Maternal Grandfather    Depression Maternal Grandfather    Cancer Maternal Grandfather    Allergies  Allergen Reactions   Metformin Other (See Comments)    Stomach upset   Current Outpatient Medications  Medication Sig Dispense Refill   ergocalciferol (DRISDOL) 1.25 MG (50000 UT) capsule Take 1 capsule (50,000 Units total) by mouth once a week. 4 capsule 5   liraglutide (VICTOZA) 18 MG/3ML SOPN Inject into the skin.     mupirocin ointment (BACTROBAN) 2 % Apply 1 application topically 2 (two) times daily. 22 g 0   pantoprazole (PROTONIX) 40 MG tablet Take 1  tablet (40 mg total) by mouth daily. 90 tablet 1   No current facility-administered medications for this visit.   No results found.  Review of Systems:   A ROS was performed including pertinent positives and negatives as documented in the HPI.  Physical Exam :   Constitutional: NAD and appears stated age Neurological: Alert and oriented Psych: Appropriate affect and cooperative Last menstrual period 07/16/2021.   Comprehensive Musculoskeletal Exam:    Tenderness to palpation about the midline back.  There is no radicular radiating type pain.  No evidence of neurogenic claudication.  She has worsening midline back pain with forward flexion.    Musculoskeletal Exam  Gait Normal  Alignment Valgus bilateral   Right Left  Inspection Normal Normal  Palpation    Tenderness Lateral joint None  Crepitus None None  Effusion None None  Range of Motion     Extension 0 0  Flexion 130 130  Strength    Extension 5/5 5/5  Flexion 5/5 5/5  Ligament Exam     Generalized Laxity No No  Lachman Negative Negative   Pivot Shift Negative Negative  Anterior Drawer Negative Negative  Valgus at 0 Negative Negative  Valgus at 20 Negative Negative  Varus at 0 0 0  Varus at 20   0 0  Posterior Drawer at 90 0 0  Vascular/Lymphatic Exam    Edema None None  Venous Stasis Changes No No  Distal Circulation Normal Normal  Neurologic    Light Touch Sensation Intact Intact  Special Tests:      Imaging:   Xray (scoliosis): There is only a mild thoracic scoliosis curve which is measuring approximately less than 10 degrees   I personally reviewed and interpreted the radiographs.   Assessment:   Very pleasant 41 year old female with mid lower back pain consistent with degenerative disc disease.  I did counsel her specifically on ergonomic adjustments and bracing.  We did discuss a core strengthening program that she will participate in here and I do believe she would be a good candidate for aquatic therapy.  With regard to the right knee she does have lateral joint tenderness in the setting of a significant valgus deformity.  At this time I would like to obtain x-rays of the right knee so that we can assess for any type of lateral compartment osteoarthritis particular in the setting of her valgus deformity.  She will begin physical therapy for the core as well as the right knee and return to clinic in 1 month for reassessment.  I do believe she would also benefit from an osteoarthritis hinged type brace which is lower profile for the right knee  Plan :    -Return to clinic in 1 month for reassessment     I personally saw and evaluated the patient, and participated in the management and treatment plan.  Vanetta Mulders, MD Attending Physician, Orthopedic Surgery  This document was dictated using Dragon voice recognition software. A reasonable attempt  at proof reading has been made to minimize errors.

## 2021-08-15 ENCOUNTER — Ambulatory Visit: Payer: Medicaid Other | Admitting: Podiatry

## 2021-08-15 ENCOUNTER — Other Ambulatory Visit: Payer: Self-pay

## 2021-08-15 ENCOUNTER — Encounter: Payer: Self-pay | Admitting: Podiatry

## 2021-08-15 VITALS — BP 115/63 | HR 75 | Temp 97.7°F | Resp 16

## 2021-08-15 DIAGNOSIS — L439 Lichen planus, unspecified: Secondary | ICD-10-CM | POA: Diagnosis not present

## 2021-08-15 MED ORDER — CLOBETASOL PROPIONATE 0.05 % EX OINT: 1.0000 "application " | TOPICAL_OINTMENT | Freq: Every day | CUTANEOUS | 2 refills | Status: AC

## 2021-08-15 NOTE — Progress Notes (Signed)
°  Subjective:  Patient ID: Allison Rose, female    DOB: 01-17-1981,  MRN: [id number]  Chief Complaint  Patient presents with   Tinea Pedis    "I think I'm coming in for these moles on my feet."    40 y.o. female presents with the above complaint. History confirmed with patient.  She has multiple plaque-like itching lesions that have developed and gradually worsened over the last 2 years.  She also has some on her hands occasionally.  Objective:  Physical Exam: warm, good capillary refill, no trophic changes or ulcerative lesions, normal DP and PT pulses, normal sensory exam, and small slightly tender pruritic plaques medial ankle bilateral and on the inside of the left wrist.    Assessment:   1. Lichen planus      Plan:  Patient was evaluated and treated and all questions answered.  Discussed etiology and treatment options of lichen planus with her.  I discussed treatment with topical corticosteroids which I recommended and sent her a prescription for Temovate ointment.  Advised use once daily for 3 to 4 weeks if not improving then can transition to twice daily.  If does not improve this would recommend biopsy of the lesions.  She will return to see me in 2 months or as needed  No follow-ups on file.

## 2021-09-05 ENCOUNTER — Encounter (HOSPITAL_BASED_OUTPATIENT_CLINIC_OR_DEPARTMENT_OTHER): Payer: Self-pay | Admitting: Physical Therapy

## 2021-09-05 ENCOUNTER — Ambulatory Visit (HOSPITAL_BASED_OUTPATIENT_CLINIC_OR_DEPARTMENT_OTHER): Payer: Medicaid Other | Attending: Orthopaedic Surgery | Admitting: Physical Therapy

## 2021-09-05 ENCOUNTER — Encounter (HOSPITAL_BASED_OUTPATIENT_CLINIC_OR_DEPARTMENT_OTHER): Payer: Self-pay

## 2021-09-05 ENCOUNTER — Ambulatory Visit (HOSPITAL_BASED_OUTPATIENT_CLINIC_OR_DEPARTMENT_OTHER): Payer: Medicaid Other | Admitting: Orthopaedic Surgery

## 2021-09-05 ENCOUNTER — Other Ambulatory Visit: Payer: Self-pay

## 2021-09-05 DIAGNOSIS — G8929 Other chronic pain: Secondary | ICD-10-CM | POA: Insufficient documentation

## 2021-09-05 DIAGNOSIS — M25561 Pain in right knee: Secondary | ICD-10-CM | POA: Diagnosis not present

## 2021-09-05 DIAGNOSIS — M545 Low back pain, unspecified: Secondary | ICD-10-CM | POA: Diagnosis not present

## 2021-09-05 NOTE — Therapy (Signed)
OUTPATIENT PHYSICAL THERAPY THORACOLUMBAR EVALUATION   Patient Name: Allison Rose MRN: [id number] DOB:[birth year], 41 y.o., female Today's Date: 09/06/2021   PT End of Session - 09/05/21 1435     Visit Number 1    Number of Visits 17    Date for PT Re-Evaluation 11/04/21    Authorization Type [employer] MCD    Authorization - Visit Number 27    PT Start Time 1430    PT Stop Time 1510    PT Time Calculation (min) 40 min    Activity Tolerance Patient tolerated treatment well    Behavior During Therapy [facility] for tasks assessed/performed             History reviewed. No pertinent past medical history. Past Surgical History:  Procedure Laterality Date   GALLBLADDER SURGERY N/A 2016   TONSILLECTOMY N/A 2016   TUBAL LIGATION N/A 2011   tubial ligation N/A 2012   Patient Active Problem List   Diagnosis Date Noted   Lumbar disc disease with radiculopathy 08/01/2021   Scoliosis of lumbosacral spine 08/01/2021   Knock knee 08/01/2021   Neoplasm of uncertain behavior of skin of foot 08/01/2021   Vitamin D deficiency 08/01/2021   Vitamin B deficiency 08/01/2021   Type 2 diabetes mellitus with polyneuropathy ([place]) 07/24/2021   Peptic ulcer 07/24/2021   Situational stress 07/24/2021   Body mass index (BMI) of 40.1-44.9 in adult ([place]) 07/24/2021    PCP: Ronnell Freshwater, NP  REFERRING PROVIDER: Vanetta Mulders, MD  REFERRING DIAG: LBP, Right knee pain  THERAPY DIAG:  Chronic low back pain, unspecified back pain laterality, unspecified whether sciatica present  Chronic pain of right knee  ONSET DATE: years ago for back, recent for knee  SUBJECTIVE:                                                                                                                                                                                           SUBJECTIVE STATEMENT: It started years ago. 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 Knee started very recently- I feel the pain mostly on the outside and feel like it is drifting in. Diagnosed with scoliosis about 2 yr ago.  PERTINENT HISTORY:  DMt2  PAIN:  Are you having pain? Yes NPRS scale:  LBP- 8/10, sharp/stabbing and into hips Rt knee- 6/10  Aggravating factors: walking hurts knee Relieving factors: laying prone over elevated foot on bed  PRECAUTIONS: None  WEIGHT BEARING RESTRICTIONS No  FALLS:  Has patient  fallen in last 6 months? 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  PLOF: Independent  PATIENT GOALS  decrease pain, better stretching/proper form, maintain at home   OBJECTIVE:   DIAGNOSTIC FINDINGS:  Pt reports arthritis found on xray  PATIENT SURVEYS:  Modified ODI 17/50   COGNITION:  Overall cognitive status: Within functional limits for tasks assessed     SENSATION:  N/T in feet and hands on and off  MUSCLE LENGTH: Grossly limited  POSTURE:  Incr lumbar lordosis  PALPATION: TTP bil SIJ- more on Left  LUMBARAROM/PROM  A/PROM A/PROM  09/06/2021  Flexion   Extension   Right lateral flexion   Left lateral flexion   Right rotation   Left rotation    (Blank rows = not tested)  LE AROM/PROM:  A/PROM Right 09/06/2021 Left 09/06/2021  Hip flexion    Hip extension    Hip abduction    Hip adduction    Hip internal rotation    Hip external rotation    Knee flexion    Knee extension    Ankle dorsiflexion    Ankle plantarflexion    Ankle inversion    Ankle eversion     (Blank rows = not tested)  LE MMT:  MMT Right 09/06/2021 Left 09/06/2021  Hip flexion    Hip extension    Hip abduction 43.3 47.3  Hip adduction    Hip internal rotation    Hip external rotation    Knee flexion    Knee extension    Ankle dorsiflexion     Ankle plantarflexion    Ankle inversion    Ankle eversion     (Blank rows = not tested)    GAIT: Rt genu valgus leading to collapse of longitudinal arch of foot    TODAY'S TREATMENT  EVAL: Post pelvic tilt, also added march Active HS stretch 2-layer lift to Lt shoe   PATIENT EDUCATION:  Education details: Geophysicist/field seismologist of condition, POC, HEP, exercise form/rationale, aquatics Person educated: Patient Education method: Consulting civil engineer, Media planner, Corporate treasurer cues, Verbal cues, and Handouts Education comprehension: verbalized understanding, returned demonstration, verbal cues required, tactile cues required, and needs further education   HOME EXERCISE PROGRAM: [id number]  ASSESSMENT:  CLINICAL IMPRESSION: Patient is a 41 y.o. F who was seen today for physical therapy evaluation and treatment for acute on Chronic LBP and acute knee pain.    OBJECTIVE IMPAIRMENTS decreased activity tolerance, decreased endurance, difficulty walking, decreased ROM, decreased strength, increased muscle spasms, impaired flexibility, improper body mechanics, postural dysfunction, obesity, and pain.   ACTIVITY LIMITATIONS cleaning, community activity, driving, and occupation.   PERSONAL FACTORS 3+ comorbidities: scoliosis, polyneuropathy, BMI  are also affecting patient's functional outcome.    REHAB POTENTIAL: Good  CLINICAL DECISION MAKING: Stable/uncomplicated  EVALUATION COMPLEXITY: Low   GOALS: Goals reviewed with patient? Yes  SHORT TERM GOALS:  STG Name Target Date Goal status  1 Able to demo proper abdominal contraction through exercises Baseline:  began educating at eval 09/29/21 INITIAL  2 Pt will demo proper hip hinge Baseline: requires education 09/29/21 INITIAL  3 Will establish if heel lift is appropriate to continue Baseline: will monitor 09/29/21 INITIAL   LONG TERM GOALS:   LTG Name Target Date Goal status  1 Able to tolerate necessary positions for work with knowledge of  maintaining low pain levels Baseline: will educate and  progress 11/04/21 INITIAL  2 Pt will be independent in long term HEP for lumbopelvic stability and gross flexibility Baseline: will progress as appropriate 11/04/21 INITIAL  3 Able to navigate stairs without increased pain Baseline: pain at eval, lives in 2nd floor apartment 11/04/21 INITIAL  4 Pt will improve ODI score by MDC Baseline: see documentation 11/04/21 INITIAL                  PLAN: PT FREQUENCY: 1-2x/week  PT DURATION: 8 weeks  PLANNED INTERVENTIONS: Therapeutic exercises, Therapeutic activity, Neuromuscular re-education, Balance training, Gait training, Patient/Family education, Joint manipulation, Joint mobilization, Stair training, Aquatic Therapy, Dry Needling, Electrical stimulation, Spinal mobilization, Cryotherapy, Moist heat, Taping, Traction, Manual therapy, and Neuro Muscular re-education  PLAN FOR NEXT SESSION: manual therapy PRN, progress core strength   Mccayla Shimada C. Yetta Marceaux PT, DPT 09/06/21 8:41 PM  Check all possible CPT codes: 23300- Therapeutic Exercise, [phone]- Neuro Re-education, [phone] - Gait Training, 97140 - Manual Therapy, 97530 - Therapeutic Activities, 97535 - Self Care, [phone] - Mechanical traction, 97014 - Electrical stimulation (unattended), and [id number] - Aquatic therapy

## 2021-09-06 ENCOUNTER — Encounter (HOSPITAL_BASED_OUTPATIENT_CLINIC_OR_DEPARTMENT_OTHER): Payer: Self-pay | Admitting: Physical Therapy

## 2021-09-11 NOTE — Therapy (Signed)
?OUTPATIENT PHYSICAL THERAPY TREATMENT NOTE ? ? ?Patient Name: Allison Rose ?MRN: [id number] ?DOB:14-Mar-1981, 41 y.o., female ?Today's Date: 09/12/2021 ? ?PCP: Ronnell Freshwater, NP ?REFERRING PROVIDER: Ronnell Freshwater, NP ? ? PT End of Session - 09/12/21 0804   ? ? Visit Number 2   ? Number of Visits 17   ? Date for PT Re-Evaluation 11/04/21   ? Authorization Type [employer] MCD   ? Authorization - Visit Number 27   ? PT Start Time 0801   ? PT Stop Time 0840   ? PT Time Calculation (min) 39 min   ? Activity Tolerance Patient tolerated treatment well   ? Behavior During Therapy [facility] for tasks assessed/performed   ? ?  ?  ? ?  ? ? ?History reviewed. No pertinent past medical history. ?Past Surgical History:  ?Procedure Laterality Date  ? GALLBLADDER SURGERY N/A 2016  ? TONSILLECTOMY N/A 2016  ? TUBAL LIGATION N/A 2011  ? tubial ligation N/A 2012  ? ?Patient Active Problem List  ? Diagnosis Date Noted  ? Lumbar disc disease with radiculopathy 08/01/2021  ? Scoliosis of lumbosacral spine 08/01/2021  ? Knock knee 08/01/2021  ? Neoplasm of uncertain behavior of skin of foot 08/01/2021  ? Vitamin D deficiency 08/01/2021  ? Vitamin B deficiency 08/01/2021  ? Type 2 diabetes mellitus with polyneuropathy ([place]) 07/24/2021  ? Peptic ulcer 07/24/2021  ? Situational stress 07/24/2021  ? Body mass index (BMI) of 40.1-44.9 in adult [facility]) 07/24/2021  ? ?PCP: Ronnell Freshwater, NP ?  ?REFERRING PROVIDER: Vanetta Mulders, MD ?  ?REFERRING DIAG: LBP, Right knee pain ?  ?THERAPY DIAG:  ?Chronic low back pain, unspecified back pain laterality, unspecified whether sciatica present ?  ?Chronic pain of right knee ?  ?ONSET DATE: years ago for back, recent for knee ?  ?SUBJECTIVE:                                                                                                                                                                                          ?  ?SUBJECTIVE STATEMENT: ?Pt reports she can tell a huge difference since having lift in  shoe.  Plans to buy more for other shoes. Her pain is much improved.  ? ? ?PERTINENT HISTORY:  ?DMt2 ?  ?PAIN:  ?Are you having pain? Yes ? ?LBP- 7/10, sharp/stabbing and into hips ?Rt knee- 6/10 ?  ?Aggravating factors: walking hurts knee ?Relieving factors: laying prone over elevated foot on bed ?  ?PRECAUTIONS: None ?  ?PATIENT GOALS  decrease pain, better stretching/proper form, maintain at home ?  ?  ?OBJECTIVE:  ?  ?DIAGNOSTIC FINDINGS:  ?  Pt reports arthritis found on xray ?  ?PATIENT SURVEYS:  ?Modified ODI 17/50 ?  ?  ?COGNITION: ?         Overall cognitive status: Within functional limits for tasks assessed              ?          ?SENSATION: ?         N/T in feet and hands on and off ?  ?MUSCLE LENGTH: ?Grossly limited ?  ?POSTURE:  ?Incr lumbar lordosis ?  ?PALPATION: ?TTP bil SIJ- more on Left ?  ?LUMBARAROM/PROM ?  ?A/PROM A/PROM  ?09/06/2021  ?Flexion    ?Extension    ?Right lateral flexion    ?Left lateral flexion    ?Right rotation    ?Left rotation    ? (Blank rows = not tested) ?  ?LE AROM/PROM: ?  ?A/PROM Right ?09/06/2021 Left ?09/06/2021  ?Hip flexion      ?Hip extension      ?Hip abduction      ?Hip adduction      ?Hip internal rotation      ?Hip external rotation      ?Knee flexion      ?Knee extension      ?Ankle dorsiflexion      ?Ankle plantarflexion      ?Ankle inversion      ?Ankle eversion      ? (Blank rows = not tested) ?  ?LE MMT: ?  ?MMT Right ?09/06/2021 Left ?09/06/2021  ?Hip flexion      ?Hip extension      ?Hip abduction 43.3 47.3  ?Hip adduction      ?Hip internal rotation      ?Hip external rotation      ?Knee flexion      ?Knee extension      ?Ankle dorsiflexion      ?Ankle plantarflexion      ?Ankle inversion      ?Ankle eversion      ? (Blank rows = not tested) ?  ?  ?  ?GAIT: ?Rt genu valgus leading to collapse of longitudinal arch of foot ?  ?  ?  ?TODAY'S TREATMENT  ? ?Pt seen for aquatic therapy today.  Treatment took place in water 3.25-4 ft in depth at the [facility] pool. Temp of water was 97?[place] Kitchen  Pt entered/exited the pool via stairs independently with single rail. ? ?Warm up: ?Forward and backward walking.  Forward march. ?Side stepping with arm abdct/add   ?Holding onto wall: hip ext x 8 each leg, cues to decrease height of kick. ?Holding onto Yellow noodle: forward trunk flexion for back stretch x 10 sec, repeated 5 x, repeated to sides x 3 each.  ?    Backward walking, mini squats x 10.  Ab set with gentle noodle press down x 5 sec x 3 (switched to blue noodle for this exercise); Hip abdct with knee flexion x 10 each leg, alternating. Hip flexion/ knee flexion crossing midline x 10 (relieves back) ?Holding onto blue noodle: Ab set with gentle noodle press down x 5 sec x 3 ?Wide stance with trunk rotation, hand claps, 5 each direction. ?Supine float with nekdoodle, yellow noodle under arms, blue noodle under legs x 4 min for decompression.  ? ?Pt requires buoyancy for support and to offload joints with strengthening exercises. Viscosity of the water is needed for resistance of strengthening; water current perturbations provides challenge to standing balance unsupported, requiring  increased core activation. ? ?EVAL: ?Post pelvic tilt, also added march ?Active HS stretch ?2-layer lift to Lt shoe ?  ?  ?PATIENT EDUCATION:  ?Education details: exercise form/rationale, aquatics intro ?Person educated: Patient ?Education method: Explanation, Demonstration, Tactile cues, Verbal cues,  ?Education comprehension: verbalized understanding, returned demonstration, verbal cues required, tactile cues required, and needs further education ?  ?  ?HOME EXERCISE PROGRAM: ?[id number] ?  ?ASSESSMENT: ?  ?CLINICAL IMPRESSION: ? ?Retro gait decreased back pain, otherwise back pain remained at 7/10.  Rt knee pain decreased to 3/10 with exercises.  Rotational exercises were tolerated well. She is confident and independent in the aquatic setting. Minor cues for relaxing knees in stance  position. Supine float relieved all pain. Goals are ongoing.  ?  ?  ?OBJECTIVE IMPAIRMENTS decreased activity tolerance, decreased endurance, difficulty walking, decreased ROM, decreased strength, increased muscle spasms, impaired flexibility, improper body mechanics, postural dysfunction, obesity, and pain.  ?   ?  ?GOALS: ?Goals reviewed with patient? Yes ?  ?SHORT TERM GOALS: ?  ?STG Name Target Date Goal status  ?1 Able to demo proper abdominal contraction through exercises ?Baseline:  began educating at eval 09/29/21 INITIAL  ?2 Pt will demo proper hip hinge ?Baseline: requires education 09/29/21 INITIAL  ?3 Will establish if heel lift is appropriate to continue ?Baseline: will monitor 09/29/21 INITIAL  ?  ?LONG TERM GOALS:  ?  ?LTG Name Target Date Goal status  ?1 Able to tolerate necessary positions for work with knowledge of maintaining low pain levels ?Baseline: will educate and progress 11/04/21 INITIAL  ?2 Pt will be independent in long term HEP for lumbopelvic stability and gross flexibility ?Baseline: will progress as appropriate 11/04/21 INITIAL  ?3 Able to navigate stairs without increased pain ?Baseline: pain at eval, lives in 2nd floor apartment 11/04/21 INITIAL  ?4 Pt will improve ODI score by MDC ?Baseline: see documentation 11/04/21 INITIAL  ?         ?         ?         ?  ?PLAN: ?PT FREQUENCY: 1-2x/week ?  ?PT DURATION: 8 weeks ?  ?PLANNED INTERVENTIONS: Therapeutic exercises, Therapeutic activity, Neuromuscular re-education, Balance training, Gait training, Patient/Family education, Joint manipulation, Joint mobilization, Stair training, Aquatic Therapy, Dry Needling, Electrical stimulation, Spinal mobilization, Cryotherapy, Moist heat, Taping, Traction, Manual therapy, and Neuro Muscular re-education ?  ?PLAN FOR NEXT SESSION: manual therapy PRN, progress core strength.  Assess response to aquatic therapy. ? ?Kerin Perna, PTA ?09/12/21 8:49 AM ? ?

## 2021-09-12 ENCOUNTER — Ambulatory Visit (HOSPITAL_BASED_OUTPATIENT_CLINIC_OR_DEPARTMENT_OTHER): Payer: Medicaid Other | Attending: Orthopaedic Surgery | Admitting: Physical Therapy

## 2021-09-12 ENCOUNTER — Other Ambulatory Visit: Payer: Self-pay

## 2021-09-12 ENCOUNTER — Encounter (HOSPITAL_BASED_OUTPATIENT_CLINIC_OR_DEPARTMENT_OTHER): Payer: Self-pay | Admitting: Physical Therapy

## 2021-09-12 DIAGNOSIS — M25561 Pain in right knee: Secondary | ICD-10-CM | POA: Diagnosis present

## 2021-09-12 DIAGNOSIS — M545 Low back pain, unspecified: Secondary | ICD-10-CM | POA: Diagnosis not present

## 2021-09-12 DIAGNOSIS — G8929 Other chronic pain: Secondary | ICD-10-CM | POA: Insufficient documentation

## 2021-09-18 NOTE — Therapy (Signed)
?OUTPATIENT PHYSICAL THERAPY TREATMENT NOTE ? ? ?Patient Name: Allison Rose ?MRN: [id number] ?DOB:01/31/1981, 41 y.o., female ?Today's Date: 09/19/2021 ? ?PCP: Ronnell Freshwater, NP ?REFERRING PROVIDER: Ronnell Freshwater, NP ? ? PT End of Session - 09/19/21 0810   ? ? Visit Number 3   ? Number of Visits 17   ? Date for PT Re-Evaluation 11/04/21   ? Authorization Type [employer] MCD   ? Authorization - Visit Number 27   ? PT Start Time 0802   ? PT Stop Time 0845   ? PT Time Calculation (min) 43 min   ? Activity Tolerance Patient tolerated treatment well   ? Behavior During Therapy [facility] for tasks assessed/performed   ? ?  ?  ? ?  ? ? ? ?History reviewed. No pertinent past medical history. ?Past Surgical History:  ?Procedure Laterality Date  ? GALLBLADDER SURGERY N/A 2016  ? TONSILLECTOMY N/A 2016  ? TUBAL LIGATION N/A 2011  ? tubial ligation N/A 2012  ? ?Patient Active Problem List  ? Diagnosis Date Noted  ? Lumbar disc disease with radiculopathy 08/01/2021  ? Scoliosis of lumbosacral spine 08/01/2021  ? Knock knee 08/01/2021  ? Neoplasm of uncertain behavior of skin of foot 08/01/2021  ? Vitamin D deficiency 08/01/2021  ? Vitamin B deficiency 08/01/2021  ? Type 2 diabetes mellitus with polyneuropathy ([place]) 07/24/2021  ? Peptic ulcer 07/24/2021  ? Situational stress 07/24/2021  ? Body mass index (BMI) of 40.1-44.9 in adult [facility]) 07/24/2021  ? ?PCP: Ronnell Freshwater, NP ?  ?REFERRING PROVIDER: Vanetta Mulders, MD ?  ?REFERRING DIAG: LBP, Right knee pain ?  ?THERAPY DIAG:  ?Chronic low back pain, unspecified back pain laterality, unspecified whether sciatica present ?  ?Chronic pain of right knee ?  ?ONSET DATE: years ago for back, recent for knee ?  ?SUBJECTIVE:                                                                                                                                                                                          ?  ?SUBJECTIVE STATEMENT: ?Pt reports she was so sore after last session.  She felt  great when she got out of the water.  Pain hit me when I tried to get out of truck after session, 20 out of 10 pain. She reports she was sore for a few days afterwards.  Had to use cane at work. ? ?PERTINENT HISTORY:  ?DMt2 ?  ?PAIN:  ?Are you having pain? 8/10 ? ?Location : LBP and Rt knee ?  ?Aggravating factors: walking hurts knee ?Relieving factors: laying prone over elevated foot on bed ?  ?  PRECAUTIONS: None ?  ?PATIENT GOALS  decrease pain, better stretching/proper form, maintain at home ?  ?  ?OBJECTIVE:  ?  ?DIAGNOSTIC FINDINGS:  ?Pt reports arthritis found on xray ?  ?PATIENT SURVEYS:  ?Modified ODI 17/50 ?  ?  ?COGNITION: ?         Overall cognitive status: Within functional limits for tasks assessed              ?          ?SENSATION: ?         N/T in feet and hands on and off ?  ?MUSCLE LENGTH: ?Grossly limited ?  ?POSTURE:  ?Incr lumbar lordosis ?  ?PALPATION: ?TTP bil SIJ- more on Left ?  ?LUMBARAROM/PROM ?  ?A/PROM A/PROM  ?09/06/2021  ?Flexion    ?Extension    ?Right lateral flexion    ?Left lateral flexion    ?Right rotation    ?Left rotation    ? (Blank rows = not tested) ?  ?LE AROM/PROM: ?  ?A/PROM Right ?09/06/2021 Left ?09/06/2021  ?Hip flexion      ?Hip extension      ?Hip abduction      ?Hip adduction      ?Hip internal rotation      ?Hip external rotation      ?Knee flexion      ?Knee extension      ?Ankle dorsiflexion      ?Ankle plantarflexion      ?Ankle inversion      ?Ankle eversion      ? (Blank rows = not tested) ?  ?LE MMT: ?  ?MMT Right ?09/06/2021 Left ?09/06/2021  ?Hip flexion      ?Hip extension      ?Hip abduction 43.3 47.3  ?Hip adduction      ?Hip internal rotation      ?Hip external rotation      ?Knee flexion      ?Knee extension      ?Ankle dorsiflexion      ?Ankle plantarflexion      ?Ankle inversion      ?Ankle eversion      ? (Blank rows = not tested) ?  ?  ?  ?GAIT: ?Rt genu valgus leading to collapse of longitudinal arch of foot ?  ?  ?  ?TODAY'S TREATMENT  ? ?Pt seen for  aquatic therapy today.  Treatment took place in water 3.25-4 ft in depth at the [facility] pool. Temp of water was 96?.  Pt entered/exited the pool via stairs independently with single rail. ? ?Warm up: ?Forward and backward walking (without device)   ?Side stepping  ?Ab set with shoulder ext to neutral x 10 with blue noodle ?Holding onto wall: hip ext x 10 each leg, 2 sets each leg ?Squats holding wall x 20  ?Holding onto Yellow noodle: forward trunk flexion for back stretch x 10 sec, repeated 3 x, repeated to sides x 2 each.  ?Backward walking  ?Hip flexion/ knee flexion crossing midline x 10 (relieves back); repeated into abdct x 10 each  ?Sit to stand from water bench x 10 with core engaged (blue step stool to stand on) ?Single KTC x 10 sec x 2 each leg ?Scap squeeze x 2, shoulder rolls x 10 ?Seated fig 4 x 15 sec x 2 reps each leg ?Supine float with nekdoodle, yellow noodle under arms, blue noodle under legs x 5 min for decompression.  ? ?Pt requires buoyancy for  support and to offload joints with strengthening exercises. Viscosity of the water is needed for resistance of strengthening; water current perturbations provides challenge to standing balance unsupported, requiring increased core activation. ? ?EVAL: ?Post pelvic tilt, also added march ?Active HS stretch ?2-layer lift to Lt shoe ?  ?  ?PATIENT EDUCATION:  ?Education details: exercise form/rationale, aquatics intro ?Person educated: Patient ?Education method: Explanation, Demonstration, Tactile cues, Verbal cues,  ?Education comprehension: verbalized understanding, returned demonstration, verbal cues required, tactile cues required, and needs further education ?  ?  ?HOME EXERCISE PROGRAM: ?[id number] ?  ?ASSESSMENT: ?  ?CLINICAL IMPRESSION: ?Pt guarded with exercise today due to increased pain after last session. With time, pt was able to relax more in the water.  ?Rt knee pain decreased to 6/10 during exercise, low back pain remained 8/10. Pt  reported relief with fig 4 stretch and knee to chest.  Supine float relieved all pain. Address seated posture and stretches for work day. Goals are ongoing.  ?  ?  ?OBJECTIVE IMPAIRMENTS decreased activity tolerance, decreased endurance, difficulty walking, decreased ROM, decreased strength, increased muscle spasms, impaired flexibility, improper body mechanics, postural dysfunction, obesity, and pain.  ?   ?  ?GOALS: ?Goals reviewed with patient? Yes ?  ?SHORT TERM GOALS: ?  ?STG Name Target Date Goal status  ?1 Able to demo proper abdominal contraction through exercises ?Baseline:  began educating at eval 09/29/21 INITIAL  ?2 Pt will demo proper hip hinge ?Baseline: requires education 09/29/21 INITIAL  ?3 Will establish if heel lift is appropriate to continue ?Baseline: will monitor 09/29/21 INITIAL  ?  ?LONG TERM GOALS:  ?  ?LTG Name Target Date Goal status  ?1 Able to tolerate necessary positions for work with knowledge of maintaining low pain levels ?Baseline: will educate and progress 11/04/21 INITIAL  ?2 Pt will be independent in long term HEP for lumbopelvic stability and gross flexibility ?Baseline: will progress as appropriate 11/04/21 INITIAL  ?3 Able to navigate stairs without increased pain ?Baseline: pain at eval, lives in 2nd floor apartment 11/04/21 INITIAL  ?4 Pt will improve ODI score by MDC ?Baseline: see documentation 11/04/21 INITIAL  ?         ?         ?         ?  ?PLAN: ?PT FREQUENCY: 1-2x/week ?  ?PT DURATION: 8 weeks ?  ?PLANNED INTERVENTIONS: Therapeutic exercises, Therapeutic activity, Neuromuscular re-education, Balance training, Gait training, Patient/Family education, Joint manipulation, Joint mobilization, Stair training, Aquatic Therapy, Dry Needling, Electrical stimulation, Spinal mobilization, Cryotherapy, Moist heat, Taping, Traction, Manual therapy, and Neuro Muscular re-education ?  ?PLAN FOR NEXT SESSION: manual therapy PRN, progress core strength.  Continue aquatic  therapy. ? ?Kerin Perna, PTA ?09/19/21 9:36 AM ? ? ?

## 2021-09-19 ENCOUNTER — Encounter (HOSPITAL_BASED_OUTPATIENT_CLINIC_OR_DEPARTMENT_OTHER): Payer: Self-pay | Admitting: Physical Therapy

## 2021-09-19 ENCOUNTER — Ambulatory Visit (HOSPITAL_BASED_OUTPATIENT_CLINIC_OR_DEPARTMENT_OTHER): Payer: Medicaid Other | Admitting: Physical Therapy

## 2021-09-19 ENCOUNTER — Other Ambulatory Visit: Payer: Self-pay

## 2021-09-19 ENCOUNTER — Ambulatory Visit (HOSPITAL_BASED_OUTPATIENT_CLINIC_OR_DEPARTMENT_OTHER): Payer: Medicaid Other | Admitting: Orthopaedic Surgery

## 2021-09-19 DIAGNOSIS — M1711 Unilateral primary osteoarthritis, right knee: Secondary | ICD-10-CM

## 2021-09-19 DIAGNOSIS — M545 Low back pain, unspecified: Secondary | ICD-10-CM | POA: Diagnosis not present

## 2021-09-19 DIAGNOSIS — G8929 Other chronic pain: Secondary | ICD-10-CM

## 2021-09-19 MED ORDER — LIDOCAINE HCL 1 % IJ SOLN
4.0000 mL | INTRAMUSCULAR | Status: AC | PRN
  Administered 2021-09-19: 4 mL

## 2021-09-19 MED ORDER — TRIAMCINOLONE ACETONIDE 40 MG/ML IJ SUSP
80.0000 mg | INTRAMUSCULAR | Status: AC | PRN
  Administered 2021-09-19: 80 mg via INTRA_ARTICULAR

## 2021-09-19 NOTE — Progress Notes (Signed)
Chief Complaint: Low back pain and right knee pain     History of Present Illness:   09/19/2021: Presents today for follow-up of her right knee.  She states that after her first session she did have a significant amount of pain but this is somewhat minimal.  She is overall feeling better and stronger.  She has gotten a heel lift for the left foot as well which is dramatically helping.  Today she does work with physical therapy and having some lateral based knee pain.  Allison Rose is a 41 y.o. female presents with low back pain and right knee pain which has been going on for several years.  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  She was previously very active in [place] although has been less active at this time.  She does have a history of diabetic neuropathy.  She states that she has gained some weight since moved to [place] which she is happy with.  She is also not been able to do her exercises for her back.  She says that previously working on her back strengthening and swimming did not make her back feel dramatically better.  She has tried bracing as well as ergonomic adjustments for the truck.  She takes Tylenol and ibuprofen as needed for the back.  She denies any specific knee injury.  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  She has been wearing an over-the-counter brace for the right knee but this is only somewhat helpful.  She is never had injections or physical therapy for the right knee.    Surgical History:   None  PMH/PSH/Family History/Social History/Meds/Allergies:   No past medical history on file. Past Surgical History:  Procedure Laterality Date   GALLBLADDER SURGERY N/A 2016   TONSILLECTOMY N/A 2016   TUBAL LIGATION N/A 2011   tubial ligation N/A 2012   Social History   Socioeconomic History   Marital status: Legally Separated    Spouse name: Not on file    Number of children: Not on file   Years of education: Not on file   Highest education level: Not on file  Occupational History   Not on file  Tobacco Use   Smoking status: Never   Smokeless tobacco: Never  Substance and Sexual Activity   Alcohol use: Not Currently   Drug use: Yes    Types: Marijuana   Sexual activity: Not Currently  Other Topics Concern   Not on file  Social History Narrative   Not on file   Social Determinants of Health   Financial Resource Strain: Not on file  Food Insecurity: Not on file  Transportation Needs: Not on file  Physical Activity: Not on file  Stress: Not on file  Social Connections: Not on file   Family History  Problem Relation Age of Onset   Alcoholism Mother    Depression Mother    Depression Father    Alcoholism Father    Depression Maternal Grandmother    Cancer Maternal Grandmother    Diabetes Maternal Grandmother    Diabetes Maternal Grandfather    Depression Maternal Grandfather    Cancer Maternal Grandfather    Allergies  Allergen Reactions   Metformin Other (See Comments)  Stomach upset   Current Outpatient Medications  Medication Sig Dispense Refill   clobetasol ointment (TEMOVATE) 6.76 % Apply 1 application topically daily. 30 g 2   ergocalciferol (DRISDOL) 1.25 MG (50000 UT) capsule Take 1 capsule (50,000 Units total) by mouth once a week. (Patient not taking: Reported on 08/15/2021) 4 capsule 5   liraglutide (VICTOZA) 18 MG/3ML SOPN Inject into the skin. (Patient not taking: Reported on 08/15/2021)     mupirocin ointment (BACTROBAN) 2 % Apply 1 application topically 2 (two) times daily. (Patient not taking: Reported on 08/15/2021) 22 g 0   pantoprazole (PROTONIX) 40 MG tablet Take 1 tablet (40 mg total) by mouth daily. 90 tablet 1   No current facility-administered medications for this visit.   No results found.  Review of Systems:   A ROS was performed including pertinent positives and  negatives as documented in the HPI.  Physical Exam :   Constitutional: NAD and appears stated age Neurological: Alert and oriented Psych: Appropriate affect and cooperative There were no vitals taken for this visit.   Comprehensive Musculoskeletal Exam:    Tenderness to palpation about the midline back.  There is no radicular radiating type pain.  No evidence of neurogenic claudication.  She has worsening midline back pain with forward flexion.    Musculoskeletal Exam  Gait Normal  Alignment Valgus bilateral   Right Left  Inspection Normal Normal  Palpation    Tenderness Lateral joint None  Crepitus None None  Effusion None None  Range of Motion    Extension 0 0  Flexion 130 130  Strength    Extension 5/5 5/5  Flexion 5/5 5/5  Ligament Exam     Generalized Laxity No No  Lachman Negative Negative   Pivot Shift Negative Negative  Anterior Drawer Negative Negative  Valgus at 0 Negative Negative  Valgus at 20 Negative Negative  Varus at 0 0 0  Varus at 20   0 0  Posterior Drawer at 90 0 0  Vascular/Lymphatic Exam    Edema None None  Venous Stasis Changes No No  Distal Circulation Normal Normal  Neurologic    Light Touch Sensation Intact Intact  Special Tests:      Imaging:   Xray (scoliosis): There is only a mild thoracic scoliosis curve which is measuring approximately less than 10 degrees  X-rays standing views and right knee AP: She has significant valgus involving the right knee with weightbearing plumbline going centrally to the lateral tibial plateau  I personally reviewed and interpreted the radiographs.   Assessment:   Very pleasant 41 year old female with mid lower back pain consistent with degenerative disc disease.  I did counsel her specifically on ergonomic adjustments and bracing.  We did discuss a core strengthening program that she will participate in here and I do believe she would be a good candidate for aquatic therapy.  With regard to the  right knee she does have lateral joint tenderness in the setting of a significant valgus deformity.  X-rays do show very mild arthritic changes although a full assessment of her cartilage is not fully possible with MRI.  We did discuss other options and possibilities including a meniscal injury involving the lateral side.  That being said given her significant valgus deformity I believe that any treatment algorithm would most likely recommend some type of distal femoral osteotomy to improve her alignment.  Overall she is tolerating the knee and improving with physical therapy.  I do believe that a steroid  injection into the right knee can help her out as well.  Given the significant aspect of deformity correction that would be required, she would like to continue with conservative management and strengthening of the right knee to optimize this prior to any further discussion of MRI versus surgical management  Plan :    -Right knee ultrasound-guided injection performed today after verbal consent obtained -Return to clinic 2 months for reassessment -Continue physical therapy for strengthening of the right knee and hip   Procedure Note  Patient: Alexandre Lightsey             Date of Birth: April 18, 1981           MRN: [id number]             Visit Date: 09/19/2021  Procedures: Visit Diagnoses: No diagnosis found.  Large Joint Inj on 09/19/2021 9:39 AM Indications: pain Details: 22 G 1.5 in needle, ultrasound-guided anterior approach  Arthrogram: No  Medications: 4 mL lidocaine 1 %; 80 mg triamcinolone acetonide 40 MG/ML Outcome: tolerated well, no immediate complications Procedure, treatment alternatives, risks and benefits explained, specific risks discussed. Consent was given by the patient. Immediately prior to procedure a time out was called to verify the correct patient, procedure, equipment, support staff and site/side marked as required. Patient was prepped and draped in the usual sterile fashion.          I personally saw and evaluated the patient, and participated in the management and treatment plan.  Vanetta Mulders, MD Attending Physician, Orthopedic Surgery  This document was dictated using Dragon voice recognition software. A reasonable attempt at proof reading has been made to minimize errors.

## 2021-09-23 NOTE — Therapy (Addendum)
?OUTPATIENT PHYSICAL THERAPY TREATMENT NOTE ? ? ?Patient Name: Allison Rose ?MRN: [id number] ?DOB:05/12/1981, 41 y.o., female ?Today's Date: 09/26/2021 ? ?PCP: Ronnell Freshwater, NP ?REFERRING PROVIDER: Ronnell Freshwater, NP ? ? PT End of Session - 09/26/21 0807   ? ? Visit Number 4   ? Number of Visits 17   ? Date for PT Re-Evaluation 11/04/21   ? Authorization Type [employer] MCD   ? Authorization - Visit Number 4   ? Authorization - Number of Visits 27   ? PT Start Time [phone]   ? PT Stop Time 0840   ? PT Time Calculation (min) 42 min   ? Activity Tolerance Patient tolerated treatment well   ? Behavior During Therapy [facility] for tasks assessed/performed   ? ?  ?  ? ?  ? ? ? ? ?History reviewed. No pertinent past medical history. ?Past Surgical History:  ?Procedure Laterality Date  ? GALLBLADDER SURGERY N/A 2016  ? TONSILLECTOMY N/A 2016  ? TUBAL LIGATION N/A 2011  ? tubial ligation N/A 2012  ? ?Patient Active Problem List  ? Diagnosis Date Noted  ? Lumbar disc disease with radiculopathy 08/01/2021  ? Scoliosis of lumbosacral spine 08/01/2021  ? Knock knee 08/01/2021  ? Neoplasm of uncertain behavior of skin of foot 08/01/2021  ? Vitamin D deficiency 08/01/2021  ? Vitamin B deficiency 08/01/2021  ? Type 2 diabetes mellitus with polyneuropathy ([place]) 07/24/2021  ? Peptic ulcer 07/24/2021  ? Situational stress 07/24/2021  ? Body mass index (BMI) of 40.1-44.9 in adult [facility]) 07/24/2021  ? ?PCP: Ronnell Freshwater, NP ?  ?REFERRING PROVIDER: Vanetta Mulders, MD ?  ?REFERRING DIAG: LBP, Right knee pain ?  ?THERAPY DIAG:  ?Chronic low back pain, unspecified back pain laterality, unspecified whether sciatica present ?  ?Chronic pain of right knee ?  ?ONSET DATE: years ago for back, recent for knee ?  ?SUBJECTIVE:                                                                                                                                                                                          ?  ?SUBJECTIVE STATEMENT: ?Pt reports she had  an injection in Rt knee last Monday.  Now Lt knee is hurting worse.  She states she had some pain in chest muscles after last session, but overall she tolerated it much better.   ?PERTINENT HISTORY:  ?DM2 ?  ?PAIN:  ?Are you having pain? 6-710 ? ?Location : LBP and Lt knee ?Rt knee = 2/10 ?  ?Aggravating factors: walking hurts knee ?Relieving factors: laying prone over elevated foot on bed ?  ?PRECAUTIONS: None ?  ?  PATIENT GOALS  decrease pain, better stretching/proper form, maintain at home ?  ?  ?OBJECTIVE:  *Objective findings taken at initial Evaluation, unless otherwise noted.  ?  ?DIAGNOSTIC FINDINGS:  ?Pt reports arthritis found on xray ?  ?PATIENT SURVEYS:  ?Modified ODI 17/50 ?  ?  ?COGNITION: ?         Overall cognitive status: Within functional limits for tasks assessed              ?          ?SENSATION: ?         N/T in feet and hands on and off ?  ?MUSCLE LENGTH: ?Grossly limited ?  ?POSTURE:  ?Incr lumbar lordosis ?  ?PALPATION: ?TTP bil SIJ- more on Left ?  ?LUMBARAROM/PROM ?  ?A/PROM A/PROM  ?09/06/2021  ?Flexion    ?Extension    ?Right lateral flexion    ?Left lateral flexion    ?Right rotation    ?Left rotation    ? (Blank rows = not tested) ?  ?LE AROM/PROM: ?  ?A/PROM Right ?09/06/2021 Left ?09/06/2021  ?Hip flexion      ?Hip extension      ?Hip abduction      ?Hip adduction      ?Hip internal rotation      ?Hip external rotation      ?Knee flexion      ?Knee extension      ?Ankle dorsiflexion      ?Ankle plantarflexion      ?Ankle inversion      ?Ankle eversion      ? (Blank rows = not tested) ?  ?LE MMT: ?  ?MMT Right ?09/06/2021 Left ?09/06/2021  ?Hip flexion      ?Hip extension      ?Hip abduction 43.3 47.3  ?Hip adduction      ?Hip internal rotation      ?Hip external rotation      ?Knee flexion      ?Knee extension      ?Ankle dorsiflexion      ?Ankle plantarflexion      ?Ankle inversion      ?Ankle eversion      ? (Blank rows = not tested) ?  ?  ?  ?GAIT: ?Rt genu valgus leading to collapse of  longitudinal arch of foot ?  ?  ?  ?TODAY'S TREATMENT  ? ?09/26/21 ?Pt seen for aquatic therapy today.  Treatment took place in water 3.25-4 ft in depth at the [facility] pool. Temp of water was 96?.  Pt entered/exited the pool via stairs independently with single rail. ? ?Warm up: ?Forward and backward walking (without device)   ?Side stepping with core engaged ?Ab set kickboard push down (vertical position) 5 sec x 10; then with push / pull ?Ab set with shoulder ext to neutral x 10 with blue noodle ?Holding onto wall: hip ext x 10 each leg, Hip abdct x 10 x 2 ?Seated: Holding onto noodle: forward trunk flexion for back stretch x 10 sec, repeated 3 x, repeated to sides x 2 each.  ?Hip flexion/ knee flexion crossing midline x 10 each leg(relieves back); repeated into abdct x 10 each  ?Single KTC x 10 sec x 2 each leg ?Fig 4 stretch / piriformis x 15 sec x 3 reps each leg on water bench ?Squats holding wall x 10 , cues to allow heels to come up (guarded) ?Repeated with yellow noodle between legs x 10 ?Bicycle with  yellow dumbbells under water and yellow noodle between legs ?Supine float with nekdoodle, yellow noodle under arms, blue noodle under legs x 4 min for decompression.  ? ? * Sensitive skin rock tape applied to bilat knees in upside down horseshoe shape (lateral/med knee to tibial tuberosity) with 25% stretch to increase proprioception, decompress tissue. Safe removal instructions were given with verbalized understanding.  ? ?Pt requires buoyancy for support and to offload joints with strengthening exercises. Viscosity of the water is needed for resistance of strengthening; water current perturbations provides challenge to standing balance unsupported, requiring increased core activation. ?  ?PATIENT EDUCATION:  ?Education details: exercise form/rationale, aquatics info, ktape infor ?Person educated: Patient ?Education method: Explanation, Demonstration, Tactile cues, Verbal cues,  ?Education  comprehension: verbalized understanding, returned demonstration, verbal cues required, tactile cues required, and needs further education ?  ?  ?HOME EXERCISE PROGRAM: ?[id number] ?  ?ASSESSMENT: ?  ?CLINICAL IMPRESSION: ?Pt tolerating aquatic exercises well, with report of decrease in pain overall during session.  Introduced some new aquatic exercises with potential to add to aquatic HEP with good tolerance.  Trial of Rock tape applied to knees. She is progressing towards goals.  ?  ?  ?OBJECTIVE IMPAIRMENTS decreased activity tolerance, decreased endurance, difficulty walking, decreased ROM, decreased strength, increased muscle spasms, impaired flexibility, improper body mechanics, postural dysfunction, obesity, and pain.  ?   ?  ?GOALS: ?Goals reviewed with patient? Yes ?  ?SHORT TERM GOALS: ?  ?STG Name Target Date Goal status  ?1 Able to demo proper abdominal contraction through exercises ?Baseline:  began educating at eval 09/29/21 INITIAL  ?2 Pt will demo proper hip hinge ?Baseline: requires education 09/29/21 INITIAL  ?3 Will establish if heel lift is appropriate to continue ?Baseline: will monitor 09/29/21 Achieved  ?  ?LONG TERM GOALS:  ?  ?LTG Name Target Date Goal status  ?1 Able to tolerate necessary positions for work with knowledge of maintaining low pain levels ?Baseline: will educate and progress 11/04/21 INITIAL  ?2 Pt will be independent in long term HEP for lumbopelvic stability and gross flexibility ?Baseline: will progress as appropriate 11/04/21 INITIAL  ?3 Able to navigate stairs without increased pain ?Baseline: pain at eval, lives in 2nd floor apartment 11/04/21 INITIAL  ?4 Pt will improve ODI score by MDC ?Baseline: see documentation 11/04/21 INITIAL  ?         ?         ?         ?  ?PLAN: ?PT FREQUENCY: 1-2x/week ?  ?PT DURATION: 8 weeks ?  ?PLANNED INTERVENTIONS: Therapeutic exercises, Therapeutic activity, Neuromuscular re-education, Balance training, Gait training, Patient/Family education,  Joint manipulation, Joint mobilization, Stair training, Aquatic Therapy, Dry Needling, Electrical stimulation, Spinal mobilization, Cryotherapy, Moist heat, Taping, Traction, Manual therapy, and Neuro Muscula

## 2021-09-26 ENCOUNTER — Ambulatory Visit (HOSPITAL_BASED_OUTPATIENT_CLINIC_OR_DEPARTMENT_OTHER): Payer: Medicaid Other | Admitting: Physical Therapy

## 2021-09-26 ENCOUNTER — Encounter (HOSPITAL_BASED_OUTPATIENT_CLINIC_OR_DEPARTMENT_OTHER): Payer: Self-pay | Admitting: Physical Therapy

## 2021-09-26 ENCOUNTER — Other Ambulatory Visit: Payer: Self-pay

## 2021-09-26 DIAGNOSIS — M545 Low back pain, unspecified: Secondary | ICD-10-CM

## 2021-09-26 DIAGNOSIS — G8929 Other chronic pain: Secondary | ICD-10-CM

## 2021-10-10 ENCOUNTER — Ambulatory Visit: Payer: Medicaid Other | Admitting: Podiatry

## 2021-10-10 DIAGNOSIS — M25371 Other instability, right ankle: Secondary | ICD-10-CM | POA: Diagnosis not present

## 2021-10-10 DIAGNOSIS — L439 Lichen planus, unspecified: Secondary | ICD-10-CM | POA: Diagnosis not present

## 2021-10-10 DIAGNOSIS — M24471 Recurrent dislocation, right ankle: Secondary | ICD-10-CM | POA: Diagnosis not present

## 2021-10-15 NOTE — Progress Notes (Signed)
?  Subjective:  ?Patient ID: Allison Rose, female    DOB: [birth year],  MRN: [id number] ? ?Follow-up on lichen planus, noticing the ankles continue to give out ? ?41 y.o. female presents with the above complaint. History confirmed with patient.  She has been using the steroid so far seems to be helping they are not itching anymore but are still present.  Feels like her right ankle sometimes gives out and pops ? ?Objective:  ?Physical Exam: ?warm, good capillary refill, no trophic changes or ulcerative lesions, normal DP and PT pulses, normal sensory exam, and plaque still present no longer tender or itchy.  There is subluxation with circumduction of the peroneal tendons, talar tilt and anterior drawer is negative ? ?Assessment:  ? ?1. 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 Instability of right ankle joint   ? ? ? ?Plan:  ?Patient was evaluated and treated and all questions answered. ? ?Lichen planus is improving continue the steroid as needed. ? ?Discussed with her she likely has peroneal subluxation as well as functional ankle instability.  I recommended physical therapy for this and sent a referral to PT.  She will return to see me as needed. ? ?Return if symptoms worsen or fail to improve.  ? ?

## 2021-10-30 NOTE — Progress Notes (Signed)
Established patient visit ? ? ?Patient: Allison Rose   DOB: [birth year]   41 y.o. Female  MRN: [id number] ?Visit Date: 10/31/2021 ? ? ?Chief Complaint  ?Patient presents with  ? Follow-up  ? ?Subjective  ?  ?HPI  ?The patient presents for follow up visit.  ?-DM - HgbA1c is  ?-orthopedics and physical therapy.  ?-has seen podiatry since last visit  ?-urine microalbumin is normal today.  ?-screening eye exam due. Does need a referral to eye exam . ?-states that she had very long and heavy menstrual period. States that it started out normal, but the developed into significant blood clots which lasted for 22 days. Went through three boxes of pads and three boxes of period pads. Does need to have referral to GYN. Has not been since moving here from [place].  ?-feels more fatigued.  ? ?Medications: ?Outpatient Medications Prior to Visit  ?Medication Sig  ? clobetasol ointment (TEMOVATE) 7.35 % Apply 1 application topically daily.  ? pantoprazole (PROTONIX) 40 MG tablet Take 1 tablet (40 mg total) by mouth daily.  ? ergocalciferol (DRISDOL) 1.25 MG (50000 UT) capsule Take 1 capsule (50,000 Units total) by mouth once a week. (Patient not taking: Reported on 08/15/2021)  ? mupirocin ointment (BACTROBAN) 2 % Apply 1 application topically 2 (two) times daily. (Patient not taking: Reported on 08/15/2021)  ? [DISCONTINUED] liraglutide (VICTOZA) 18 MG/3ML SOPN Inject into the skin. (Patient not taking: Reported on 08/15/2021)  ? ?No facility-administered medications prior to visit.  ? ? ?Review of Systems  ?Constitutional:  Positive for fatigue. Negative for activity change, appetite change, chills and fever.  ?HENT:  Positive for ear pain. Negative for congestion, postnasal drip, rhinorrhea, sinus pressure, sinus pain, sneezing and sore throat.   ?     States that she did go to urgent care since her last visit. Was told that her right ear was blocked with wax. Was told to take ibuprofen and clean out the ears. She still feels like the  right ear canal is blocked.   ?Eyes: Negative.   ?Respiratory:  Negative for cough, chest tightness, shortness of breath and wheezing.   ?Cardiovascular:  Negative for chest pain and palpitations.  ?Gastrointestinal:  Negative for abdominal pain, constipation, diarrhea, nausea and vomiting.  ?Endocrine: Negative for cold intolerance, heat intolerance, polydipsia and polyuria.  ?Genitourinary:  Positive for menstrual problem. Negative for dyspareunia, dysuria, flank pain, frequency and urgency.  ?     Had very long menstrual period with lots of blood clots. States that it last for 22 days.   ?Musculoskeletal:  Negative for arthralgias, back pain and myalgias.  ?Skin:  Negative for rash.  ?Allergic/Immunologic: Negative for environmental allergies.  ?Neurological:  Negative for dizziness, weakness and headaches.  ?Hematological:  Negative for adenopathy.  ?Psychiatric/Behavioral:  The patient is not nervous/anxious.   ? ? ? Objective  ?  ? ?Today's Vitals  ? 10/31/21 0849  ?BP: 127/78  ?Pulse: 80  ?Temp: 98.3 ?F (36.8 ?C)  ?SpO2: 99%  ?Weight: 242 lb 6.4 oz (110 kg)  ?Height: 5' 4.96" (1.65 m)  ? ?Body mass index is 40.39 kg/m?.  ? ?BP Readings from Last 3 Encounters:  ?10/31/21 127/78  ?08/15/21 115/63  ?08/07/21 132/85  ?  ?Wt Readings from Last 3 Encounters:  ?10/31/21 242 lb 6.4 oz (110 kg)  ?08/01/21 245 lb 1.9 oz (111.2 kg)  ?07/18/21 246 lb 6.4 oz (111.8 kg)  ?  ?Physical Exam ?Vitals and nursing note reviewed.  ?Constitutional:   ?  Appearance: Normal appearance. She is well-developed. She is obese.  ?HENT:  ?   Head: Normocephalic and atraumatic.  ?   Right Ear: Hearing, tympanic membrane, ear canal and external ear normal. Tenderness present.  ?   Left Ear: Hearing, tympanic membrane, ear canal and external ear normal.  ?   Ears:  ?   Comments: Mild cerumen present in bilateral ear canals no evidence of infection ir irritation noted at this time . ?   Nose: Nose normal.  ?   Mouth/Throat:  ?   Mouth: Mucous  membranes are moist.  ?   Pharynx: Oropharynx is clear.  ?Eyes:  ?   Pupils: Pupils are equal, round, and reactive to light.  ?Cardiovascular:  ?   Rate and Rhythm: Normal rate and regular rhythm.  ?   Pulses: Normal pulses.  ?   Heart sounds: Normal heart sounds.  ?Pulmonary:  ?   Effort: Pulmonary effort is normal.  ?   Breath sounds: Normal breath sounds.  ?Abdominal:  ?   Palpations: Abdomen is soft.  ?Musculoskeletal:     ?   General: Normal range of motion.  ?   Cervical back: Normal range of motion and neck supple.  ?Lymphadenopathy:  ?   Cervical: No cervical adenopathy.  ?Skin: ?   General: Skin is warm and dry.  ?   Capillary Refill: Capillary refill takes less than 2 seconds.  ?Neurological:  ?   General: No focal deficit present.  ?   Mental Status: She is alert and oriented to person, place, and time.  ?Psychiatric:     ?   Attention and Perception: Attention and perception normal.     ?   Mood and Affect: Mood normal. Affect is tearful.     ?   Speech: Speech normal.     ?   Behavior: Behavior normal. Behavior is cooperative.     ?   Thought Content: Thought content normal.     ?   Cognition and Memory: Cognition and memory normal.     ?   Judgment: Judgment normal.  ?  ? ? ?Results for orders placed or performed in visit on 10/31/21  ?POCT UA - Microalbumin  ?Result Value Ref Range  ? Microalbumin Ur, POC 10 mg/L  ? Creatinine, POC 300 mg/dL  ? Albumin/Creatinine Ratio, Urine, POC <30   ?POCT glycosylated hemoglobin (Hb A1C)  ?Result Value Ref Range  ? Hemoglobin A1C 5.8 (A) 4.0 - 5.6 %  ? HbA1c POC (<> result, manual entry)    ? HbA1c, POC (prediabetic range)    ? HbA1c, POC (controlled diabetic range)    ? ? Assessment & Plan  ?  ?1. Type 2 diabetes mellitus with polyneuropathy (HCC) ?HgbA1c is 5.8 today. Change victoza to ozempic. Start with 0.'25mg'$  weekly for 4 weeks then increase to 0.'25mg'$  weekly. Recheck HgbA1c in 3 months. Microalbumin normal today. Refer for diabetic eye exam.  ?- POCT UA -  Microalbumin ?- POCT glycosylated hemoglobin (Hb A1C) ?- Ambulatory referral to Ophthalmology ?- Semaglutide,0.25 or 0.'5MG'$ /DOS, (OZEMPIC, 0.25 OR 0.5 MG/DOSE,) 2 MG/1.5ML SOPN; Inject 0.'25mg'$  [place] weekly for 4 weeks. Then inject 0.'5mg'$  [place] weekly.  DX - E11.65  Dispense: 3 mL; Refill: 3 ? ?2. Body mass index (BMI) of 40.1-44.9 in adult [facility]) ?Discussed lowering calorie intake to 1500 calories per day and incorporating exercise into daily routine to help lose weight. Has had weight loss of four pounds over last few months. Addition of  ozempic may help with additional weight loss. Reassess at next visit  ? ?3. Menorrhagia with irregular cycle ?Refer to GYN for further evaluation and treatment.  ?- Ambulatory referral to Gynecology ? ?4. Depression, major, single episode, moderate ([place]) ?Referral to therapy did go through but is a very long wait for initial appointment. Discussed other options for counseling services and gave agency names to patient today.  ?  ?Problem List Items Addressed This Visit   ? ?  ? Endocrine  ? Type 2 diabetes mellitus with polyneuropathy (HCC) - Primary  ? Relevant Medications  ? Semaglutide,0.25 or 0.'5MG'$ /DOS, (OZEMPIC, 0.25 OR 0.5 MG/DOSE,) 2 MG/1.5ML SOPN  ? Other Relevant Orders  ? POCT UA - Microalbumin (Completed)  ? POCT glycosylated hemoglobin (Hb A1C) (Completed)  ? Ambulatory referral to Ophthalmology  ?  ? Other  ? Body mass index (BMI) of 40.1-44.9 in adult [facility])  ? Menorrhagia with irregular cycle  ? Relevant Orders  ? Ambulatory referral to Gynecology  ? Depression, major, single episode, moderate ([place])  ?  ? ?Return in about 3 months (around 01/30/2022) for diabetes with HgbA1c check.  ?   ? ? ? ? ?Ronnell Freshwater, NP  ?[facility] ?[phone] (phone) ?[phone] (fax) ? ?[facility]  ?

## 2021-10-31 ENCOUNTER — Encounter: Payer: Self-pay | Admitting: Nurse Practitioner

## 2021-10-31 ENCOUNTER — Ambulatory Visit (INDEPENDENT_AMBULATORY_CARE_PROVIDER_SITE_OTHER): Payer: Medicaid Other | Admitting: Nurse Practitioner

## 2021-10-31 VITALS — BP 127/78 | HR 80 | Temp 98.3°F | Ht 64.96 in | Wt 242.4 lb

## 2021-10-31 DIAGNOSIS — Z6841 Body Mass Index (BMI) 40.0 and over, adult: Secondary | ICD-10-CM

## 2021-10-31 DIAGNOSIS — E1142 Type 2 diabetes mellitus with diabetic polyneuropathy: Secondary | ICD-10-CM

## 2021-10-31 DIAGNOSIS — F321 Major depressive disorder, single episode, moderate: Secondary | ICD-10-CM | POA: Insufficient documentation

## 2021-10-31 DIAGNOSIS — N921 Excessive and frequent menstruation with irregular cycle: Secondary | ICD-10-CM | POA: Diagnosis not present

## 2021-10-31 LAB — POCT GLYCOSYLATED HEMOGLOBIN (HGB A1C): Hemoglobin A1C: 5.8 % — AB (ref 4.0–5.6)

## 2021-10-31 LAB — POCT UA - MICROALBUMIN
Albumin/Creatinine Ratio, Urine, POC: <30
Creatinine, POC: 300 mg/dL
Microalbumin Ur, POC: 10 mg/L

## 2021-10-31 MED ORDER — OZEMPIC (0.25 OR 0.5 MG/DOSE) 2 MG/1.5ML ~~LOC~~ SOPN: PEN_INJECTOR | SUBCUTANEOUS | 3 refills | Status: DC

## 2021-11-14 ENCOUNTER — Ambulatory Visit (HOSPITAL_BASED_OUTPATIENT_CLINIC_OR_DEPARTMENT_OTHER): Payer: Medicaid Other | Attending: Orthopaedic Surgery | Admitting: Physical Therapy

## 2021-11-14 ENCOUNTER — Encounter (HOSPITAL_BASED_OUTPATIENT_CLINIC_OR_DEPARTMENT_OTHER): Payer: Self-pay | Admitting: Physical Therapy

## 2021-11-14 DIAGNOSIS — L439 Lichen planus, unspecified: Secondary | ICD-10-CM | POA: Diagnosis present

## 2021-11-14 DIAGNOSIS — G8929 Other chronic pain: Secondary | ICD-10-CM | POA: Insufficient documentation

## 2021-11-14 DIAGNOSIS — M25561 Pain in right knee: Secondary | ICD-10-CM | POA: Diagnosis present

## 2021-11-14 DIAGNOSIS — M545 Low back pain, unspecified: Secondary | ICD-10-CM | POA: Diagnosis present

## 2021-11-14 DIAGNOSIS — M24471 Recurrent dislocation, right ankle: Secondary | ICD-10-CM | POA: Insufficient documentation

## 2021-11-14 DIAGNOSIS — M25371 Other instability, right ankle: Secondary | ICD-10-CM | POA: Insufficient documentation

## 2021-11-14 NOTE — Therapy (Signed)
?OUTPATIENT PHYSICAL THERAPY TREATMENT NOTE/Re-evaluation ? ? ?Patient Name: Allison Rose ?MRN: [id number] ?DOB:[birth year], 41 y.o., female ?Today's Date: 11/14/2021 ? ?PCP: Ronnell Freshwater, NP ?REFERRING PROVIDER: Vanetta Mulders, MD ? ? PT End of Session - 11/14/21 1258   ? ? Visit Number 5   ? Number of Visits 17   ? Date for PT Re-Evaluation 12/30/21   ? Authorization Type [employer] MCD   ? Authorization - Number of Visits 27   ? PT Start Time 6761   ? PT Stop Time 1340   ? PT Time Calculation (min) 43 min   ? Activity Tolerance Patient tolerated treatment well   ? Behavior During Therapy [facility] for tasks assessed/performed   ? ?  ?  ? ?  ? ? ? ? ? ?History reviewed. No pertinent past medical history. ?Past Surgical History:  ?Procedure Laterality Date  ? GALLBLADDER SURGERY N/A 2016  ? TONSILLECTOMY N/A 2016  ? TUBAL LIGATION N/A 2011  ? tubial ligation N/A 2012  ? ?Patient Active Problem List  ? Diagnosis Date Noted  ? Menorrhagia with irregular cycle 10/31/2021  ? Depression, major, single episode, moderate ([place]) 10/31/2021  ? Lumbar disc disease with radiculopathy 08/01/2021  ? Scoliosis of lumbosacral spine 08/01/2021  ? Knock knee 08/01/2021  ? Neoplasm of uncertain behavior of skin of foot 08/01/2021  ? Vitamin D deficiency 08/01/2021  ? Vitamin B deficiency 08/01/2021  ? Type 2 diabetes mellitus with polyneuropathy ([place]) 07/24/2021  ? Peptic ulcer 07/24/2021  ? Situational stress 07/24/2021  ? Body mass index (BMI) of 40.1-44.9 in adult [facility]) 07/24/2021  ? ?PCP: Ronnell Freshwater, NP ?  ?REFERRING PROVIDER: Vanetta Mulders, MD ?  ?REFERRING DIAG: LBP, Right knee pain ?  ?THERAPY DIAG:  ?Chronic low back pain, unspecified back pain laterality, unspecified whether sciatica present ?  ?Chronic pain of right knee ?  ?ONSET DATE: years ago for back, recent for knee ?  ?SUBJECTIVE:                                                                                                                                                                                           ?  ?SUBJECTIVE STATEMENT: ?The Right knee is better, still feel it every now and then. I am hard on my body at work. The left hip is screaming, it has stopped me from walking sometimes. Pain is mainly in lateral Left hip and knee.  ? ?PERTINENT HISTORY:  ?DM2 ?  ?PAIN:  ?PAIN:  ?Are you having pain? Yes: NPRS scale: 3/10 ?Pain location: lateral knee ?Pain description: achey ?Aggravating factors: being hard on it at work ?  Relieving factors: ice, heat ? ? ?  ?PRECAUTIONS: None ?  ?PATIENT GOALS  decrease pain, better stretching/proper form, maintain at home ?  ?  ?OBJECTIVE:  *Objective findings taken at initial Evaluation, unless otherwise noted.  ?  ?DIAGNOSTIC FINDINGS:  ?Pt reports arthritis found on xray ?  ?PATIENT SURVEYS:  ?Modified ODI 17/50 ?5/8: ODI 16/50 ?       ?          ?SENSATION: ?         N/T in feet and hands on and off ?  ?MUSCLE LENGTH: ?75% in bil HS with stretchy end feel ?  ?POSTURE:  ?Incr lumbar lordosis, Lt ASIS post rotated in supine ?  ?PALPATION: ?TTP bil SIJ- more on Left, Lt lateral hip TTP ?  ? ?  ? ?LE MMT: ?  ?MMT Right ?09/06/2021 Left ?09/06/2021 Left / Right ?5/8  ?Hip flexion       ?Hip extension       ?Hip abduction- sidelying at knee 43.3 47.3 33.9 / 28.3  ?Hip adduction       ?Hip internal rotation       ?Hip external rotation       ?Knee flexion       ?Knee extension       ?Ankle dorsiflexion       ?Ankle plantarflexion       ?Ankle inversion       ?Ankle eversion       ? (Blank rows = not tested) ?  ?  ?  ?GAIT: ?Bil genu valgus, Lt hip ant placed compared to Rt ?  ?  ?  ?TODAY'S TREATMENT  ? ?5/8: ?MANUAL: STM to Left hip abd group ?Figure 4 stretch ?Clams & diag hip abduction bilaterally ?Qped- alt UE & alt LE ext progressed to bird dog ?Primal push ups ?Trialed 1/2 foam roll for decompression ? ?09/26/21 ?Pt seen for aquatic therapy today.  Treatment took place in water 3.25-4 ft in depth at the [facility] pool. Temp of  water was 96?.  Pt entered/exited the pool via stairs independently with single rail. ? ?Warm up: ?Forward and backward walking (without device)   ?Side stepping with core engaged ?Ab set kickboard push down (vertical position) 5 sec x 10; then with push / pull ?Ab set with shoulder ext to neutral x 10 with blue noodle ?Holding onto wall: hip ext x 10 each leg, Hip abdct x 10 x 2 ?Seated: Holding onto noodle: forward trunk flexion for back stretch x 10 sec, repeated 3 x, repeated to sides x 2 each.  ?Hip flexion/ knee flexion crossing midline x 10 each leg(relieves back); repeated into abdct x 10 each  ?Single KTC x 10 sec x 2 each leg ?Fig 4 stretch / piriformis x 15 sec x 3 reps each leg on water bench ?Squats holding wall x 10 , cues to allow heels to come up (guarded) ?Repeated with yellow noodle between legs x 10 ?Bicycle with yellow dumbbells under water and yellow noodle between legs ?Supine float with nekdoodle, yellow noodle under arms, blue noodle under legs x 4 min for decompression.  ? ? * Sensitive skin rock tape applied to bilat knees in upside down horseshoe shape (lateral/med knee to tibial tuberosity) with 25% stretch to increase proprioception, decompress tissue. Safe removal instructions were given with verbalized understanding.  ? ?Pt requires buoyancy for support and to offload joints with strengthening exercises. Viscosity of the water is needed for resistance  of strengthening; water current perturbations provides challenge to standing balance unsupported, requiring increased core activation. ?  ?PATIENT EDUCATION:  ?Education details: exercise form/rationale, aquatics info, ktape infor ?Person educated: Patient ?Education method: Explanation, Demonstration, Tactile cues, Verbal cues,  ?Education comprehension: verbalized understanding, returned demonstration, verbal cues required, tactile cues required, and needs further education ?  ?  ?HOME EXERCISE PROGRAM: ?[id number] ?  ?ASSESSMENT: ?   ?CLINICAL IMPRESSION: ?Pt demo significant weakness and trigger points in Lt hip that have lead to compensations in gait and pain  in knee and hip. She did report feeling much better after treatment today and will benefit from further PT to improve gross lumbopelvic strength and stability.  ?  ?  ?OBJECTIVE IMPAIRMENTS decreased activity tolerance, decreased endurance, difficulty walking, decreased ROM, decreased strength, increased muscle spasms, impaired flexibility, improper body mechanics, postural dysfunction, obesity, and pain.  ?   ?  ?GOALS: ?Goals reviewed with patient? Yes ?  ?SHORT TERM GOALS: ?  ?STG Name Target Date Goal status  ?1 Able to demo proper abdominal contraction through exercises ?Baseline:  began educating at eval 09/29/21 achieved  ?2 Pt will demo proper hip hinge ?Baseline: requires education 09/29/21 achieved  ?3 Will establish if heel lift is appropriate to continue ?Baseline: will monitor 09/29/21 Achieved  ?  ?LONG TERM GOALS:  ?  ?LTG Name Target Date Goal status  ?1 Able to tolerate necessary positions for work with knowledge of maintaining low pain levels ?Baseline: will educate and progress 12/30/21 ongoing  ?2 Pt will be independent in long term HEP for lumbopelvic stability and gross flexibility ?Baseline: will progress as appropriate 12/30/21 ongoing  ?3 Able to navigate stairs without increased pain ?Baseline: Right knee is fine, it's the left side that is a 7/10 11/04/21 ongoing  ?4 Pt will improve ODI score by MDC ?Baseline: see documentation 12/30/21 ongoing  ? 5  Hip abd strength to norm of 45lb via dynamometry ?Baseline: see chart  12/30/21  new  ?         ?         ?  ?PLAN: ?PT FREQUENCY: 1-2x/week ?  ?PT DURATION: 8 weeks ?  ?PLANNED INTERVENTIONS: Therapeutic exercises, Therapeutic activity, Neuromuscular re-education, Balance training, Gait training, Patient/Family education, Joint manipulation, Joint mobilization, Stair training, Aquatic Therapy, Dry Needling, Electrical  stimulation, Spinal mobilization, Cryotherapy, Moist heat, Taping, Traction, Manual therapy, and Neuro Muscular re-education ?  ?PLAN FOR NEXT SESSION: cont core/hip gross strength ? ? ?Idaliz Tinkle C. Beva Remund PT,

## 2021-11-21 ENCOUNTER — Encounter (HOSPITAL_BASED_OUTPATIENT_CLINIC_OR_DEPARTMENT_OTHER): Payer: Self-pay | Admitting: Physical Therapy

## 2021-11-21 ENCOUNTER — Ambulatory Visit (HOSPITAL_BASED_OUTPATIENT_CLINIC_OR_DEPARTMENT_OTHER): Payer: Medicaid Other | Admitting: Physical Therapy

## 2021-11-21 DIAGNOSIS — M545 Low back pain, unspecified: Secondary | ICD-10-CM | POA: Diagnosis not present

## 2021-11-21 DIAGNOSIS — G8929 Other chronic pain: Secondary | ICD-10-CM

## 2021-11-21 NOTE — Therapy (Signed)
?OUTPATIENT PHYSICAL THERAPY TREATMENT NOTE/Re-evaluation ? ? ?Patient Name: Allison Rose ?MRN: [id number] ?DOB:[birth year], 41 y.o., female ?Today's Date: 11/21/2021 ? ?PCP: Ronnell Freshwater, NP ?REFERRING PROVIDER: Vanetta Mulders, MD ? ? PT End of Session - 11/21/21 1309   ? ? Visit Number 6   ? Number of Visits 17   ? Date for PT Re-Evaluation 12/30/21   ? Authorization Type [employer] MCD   ? PT Start Time 1100   ? PT Stop Time 5093   ? PT Time Calculation (min) 42 min   ? Activity Tolerance Patient tolerated treatment well   ? Behavior During Therapy [facility] for tasks assessed/performed   ? ?  ?  ? ?  ? ? ? ? ? ? ?History reviewed. No pertinent past medical history. ?Past Surgical History:  ?Procedure Laterality Date  ? GALLBLADDER SURGERY N/A 2016  ? TONSILLECTOMY N/A 2016  ? TUBAL LIGATION N/A 2011  ? tubial ligation N/A 2012  ? ?Patient Active Problem List  ? Diagnosis Date Noted  ? Menorrhagia with irregular cycle 10/31/2021  ? Depression, major, single episode, moderate ([place]) 10/31/2021  ? Lumbar disc disease with radiculopathy 08/01/2021  ? Scoliosis of lumbosacral spine 08/01/2021  ? Knock knee 08/01/2021  ? Neoplasm of uncertain behavior of skin of foot 08/01/2021  ? Vitamin D deficiency 08/01/2021  ? Vitamin B deficiency 08/01/2021  ? Type 2 diabetes mellitus with polyneuropathy ([place]) 07/24/2021  ? Peptic ulcer 07/24/2021  ? Situational stress 07/24/2021  ? Body mass index (BMI) of 40.1-44.9 in adult [facility]) 07/24/2021  ? ?PCP: Ronnell Freshwater, NP ?  ?REFERRING PROVIDER: Vanetta Mulders, MD ?  ?REFERRING DIAG: LBP, Right knee pain ?  ?THERAPY DIAG:  ?Chronic low back pain, unspecified back pain laterality, unspecified whether sciatica present ?  ?Chronic pain of right knee ?  ?ONSET DATE: years ago for back, recent for knee ?  ?SUBJECTIVE:                                                                                                                                                                                           ?  ?SUBJECTIVE STATEMENT: ?The patient reports the right knee is still doing well but her left leg and hip are still very sore. She has increased pain when she stands at work and performs work task  ? ?  ?PERTINENT HISTORY:  ?DM2 ?  ?PAIN:  ?PAIN:  ?Are you having pain? Yes: NPRS scale: 7/10 ?Pain location: left knee  ?Pain description: achey ?Aggravating factors: being hard on it at work ?Relieving factors: ice, heat ? ? ?  ?PRECAUTIONS: None ?  ?PATIENT  GOALS  decrease pain, better stretching/proper form, maintain at home ?  ?  ?OBJECTIVE:  *Objective findings taken at initial Evaluation, unless otherwise noted.  ?  ?DIAGNOSTIC FINDINGS:  ?Pt reports arthritis found on xray ?  ?PATIENT SURVEYS:  ?Modified ODI 17/50 ?5/8: ODI 16/50 ?       ?          ?SENSATION: ?         N/T in feet and hands on and off ?  ?MUSCLE LENGTH: ?75% in bil HS with stretchy end feel ?  ?POSTURE:  ?Incr lumbar lordosis, Lt ASIS post rotated in supine ?  ?PALPATION: ?TTP bil SIJ- more on Left, Lt lateral hip TTP ?  ? ?  ? ?LE MMT: ?  ?MMT Right ?09/06/2021 Left ?09/06/2021 Left / Right ?5/8  ?Hip flexion       ?Hip extension       ?Hip abduction- sidelying at knee 43.3 47.3 33.9 / 28.3  ?Hip adduction       ?Hip internal rotation       ?Hip external rotation       ?Knee flexion       ?Knee extension       ?Ankle dorsiflexion       ?Ankle plantarflexion       ?Ankle inversion       ?Ankle eversion       ? (Blank rows = not tested) ?  ?  ?  ?GAIT: ?Bil genu valgus, Lt hip ant placed compared to Rt ?  ?  ?  ?TODAY'S TREATMENT  ?5/15 ?Manual: STM to right gluteal; LAD with grade II and III oscillations left LE ; assessed knee cap ; ?Reviewed use of thera-cane for STM. Patient reports the tennis ball causes her extreme pain. She was shown the cane. She was advised she should be able to grade the pressure. She was advised to put just enough pressure to where she can feel it. We will work on self desensitization over the next few weeks.  ? ?Leg  press 50 lbs 3x10 with education on set ?Shoulder extension with abdominal breathing 3x10 10 lbs  ?Row 3x10 with cuing for breathing ? ? ?Gluteal stretch 3x20 sec reviewed for home  ? ? ?5/8: ?MANUAL: STM to Left hip abd group ?Figure 4 stretch ?Clams & diag hip abduction bilaterally ?Qped- alt UE & alt LE ext progressed to bird dog ?Primal push ups ?Trialed 1/2 foam roll for decompression ? ?09/26/21 ?Pt seen for aquatic therapy today.  Treatment took place in water 3.25-4 ft in depth at the [facility] pool. Temp of water was 96?.  Pt entered/exited the pool via stairs independently with single rail. ? ?Warm up: ?Forward and backward walking (without device)   ?Side stepping with core engaged ?Ab set kickboard push down (vertical position) 5 sec x 10; then with push / pull ?Ab set with shoulder ext to neutral x 10 with blue noodle ?Holding onto wall: hip ext x 10 each leg, Hip abdct x 10 x 2 ?Seated: Holding onto noodle: forward trunk flexion for back stretch x 10 sec, repeated 3 x, repeated to sides x 2 each.  ?Hip flexion/ knee flexion crossing midline x 10 each leg(relieves back); repeated into abdct x 10 each  ?Single KTC x 10 sec x 2 each leg ?Fig 4 stretch / piriformis x 15 sec x 3 reps each leg on water bench ?Squats holding wall x 10 , cues to allow heels  to come up (guarded) ?Repeated with yellow noodle between legs x 10 ?Bicycle with yellow dumbbells under water and yellow noodle between legs ?Supine float with nekdoodle, yellow noodle under arms, blue noodle under legs x 4 min for decompression.  ? ? * Sensitive skin rock tape applied to bilat knees in upside down horseshoe shape (lateral/med knee to tibial tuberosity) with 25% stretch to increase proprioception, decompress tissue. Safe removal instructions were given with verbalized understanding.  ? ?Pt requires buoyancy for support and to offload joints with strengthening exercises. Viscosity of the water is needed for resistance of  strengthening; water current perturbations provides challenge to standing balance unsupported, requiring increased core activation. ?  ?PATIENT EDUCATION:  ?Education details: exercise form/rationale, aquatics info, ktape infor ?Person educated: Patient ?Education method: Explanation, Demonstration, Tactile cues, Verbal cues,  ?Education comprehension: verbalized understanding, returned demonstration, verbal cues required, tactile cues required, and needs further education ?  ?  ?HOME EXERCISE PROGRAM: ?[id number] ?  ?ASSESSMENT: ?  ?CLINICAL IMPRESSION: ?The patient has had some difficulty with self soft tissue mobilization. We talked to her about adjusting her goal of soft tissue mobilization and really working on desensitization using the cane which is more controllable. She had a positive response to LAD. She has pain with hip flexion. She was interested in doing some work at [employer], We reviewed the leg press. Given her end range flexion  pain she was advised on how to set the machine up. She tolerated light weight well. She was also given UE postural/core exercises to work on at home. She has to use her legs all day long. She was given an alternative set of exercises she can do on days that her legs are fatigued and her gluteal is spasming.  ?  ?OBJECTIVE IMPAIRMENTS decreased activity tolerance, decreased endurance, difficulty walking, decreased ROM, decreased strength, increased muscle spasms, impaired flexibility, improper body mechanics, postural dysfunction, obesity, and pain.  ?   ?  ?GOALS: ?Goals reviewed with patient? Yes ?  ?SHORT TERM GOALS: ?  ?STG Name Target Date Goal status  ?1 Able to demo proper abdominal contraction through exercises ?Baseline:  began educating at eval 09/29/21 achieved  ?2 Pt will demo proper hip hinge ?Baseline: requires education 09/29/21 achieved  ?3 Will establish if heel lift is appropriate to continue ?Baseline: will monitor 09/29/21 Achieved  ?  ?LONG TERM GOALS:  ?  ?LTG  Name Target Date Goal status  ?1 Able to tolerate necessary positions for work with knowledge of maintaining low pain levels ?Baseline: will educate and progress 12/30/21 ongoing  ?2 Pt will be independent in

## 2021-11-28 ENCOUNTER — Ambulatory Visit
Admission: RE | Admit: 2021-11-28 | Discharge: 2021-11-28 | Disposition: A | Discharge status: Home / Self Care | Payer: Medicaid Other | Source: Ambulatory Visit | Attending: Internal Medicine | Admitting: Internal Medicine

## 2021-11-28 ENCOUNTER — Encounter (HOSPITAL_BASED_OUTPATIENT_CLINIC_OR_DEPARTMENT_OTHER): Payer: Self-pay | Admitting: Physical Therapy

## 2021-11-28 ENCOUNTER — Ambulatory Visit (HOSPITAL_BASED_OUTPATIENT_CLINIC_OR_DEPARTMENT_OTHER): Payer: Medicaid Other | Admitting: Physical Therapy

## 2021-11-28 VITALS — BP 140/86 | HR 66 | Temp 98.6°F | Resp 18

## 2021-11-28 DIAGNOSIS — N939 Abnormal uterine and vaginal bleeding, unspecified: Secondary | ICD-10-CM | POA: Diagnosis not present

## 2021-11-28 DIAGNOSIS — R0683 Snoring: Secondary | ICD-10-CM

## 2021-11-28 DIAGNOSIS — R5383 Other fatigue: Secondary | ICD-10-CM

## 2021-11-28 DIAGNOSIS — G8929 Other chronic pain: Secondary | ICD-10-CM

## 2021-11-28 DIAGNOSIS — Z113 Encounter for screening for infections with a predominantly sexual mode of transmission: Secondary | ICD-10-CM

## 2021-11-28 DIAGNOSIS — R609 Edema, unspecified: Secondary | ICD-10-CM

## 2021-11-28 DIAGNOSIS — N9489 Other specified conditions associated with female genital organs and menstrual cycle: Secondary | ICD-10-CM | POA: Diagnosis not present

## 2021-11-28 DIAGNOSIS — G4709 Other insomnia: Secondary | ICD-10-CM

## 2021-11-28 DIAGNOSIS — M545 Low back pain, unspecified: Secondary | ICD-10-CM | POA: Diagnosis not present

## 2021-11-28 DIAGNOSIS — R634 Abnormal weight loss: Secondary | ICD-10-CM

## 2021-11-28 DIAGNOSIS — E042 Nontoxic multinodular goiter: Secondary | ICD-10-CM

## 2021-11-28 NOTE — ED Triage Notes (Addendum)
Pt presents with nausea and abdominal cramping for 3 months. The patient states her last menstrual period she only had blood clots for 3 days. In March the patient had her period for 22 days. She has an ob-gyn appointment 03/20/22

## 2021-11-28 NOTE — Therapy (Signed)
OUTPATIENT PHYSICAL THERAPY TREATMENT NOTE/Re-evaluation   Patient Name: Allison Rose MRN: [id number] DOB:[birth year], 41 y.o., female Today's Date: 11/28/2021  PCP: Ronnell Freshwater, NP REFERRING PROVIDER: Vanetta Mulders, MD   PT End of Session - 11/28/21 1335     Visit Number 7    Number of Visits 17    Date for PT Re-Evaluation 12/30/21    Authorization Type [employer] MCD    PT Start Time 2376    PT Stop Time 1344    PT Time Calculation (min) 41 min    Activity Tolerance Patient tolerated treatment well    Behavior During Therapy [facility] for tasks assessed/performed                  History reviewed. No pertinent past medical history. Past Surgical History:  Procedure Laterality Date   GALLBLADDER SURGERY N/A 2016   TONSILLECTOMY N/A 2016   TUBAL LIGATION N/A 2011   tubial ligation N/A 2012   Patient Active Problem List   Diagnosis Date Noted   Menorrhagia with irregular cycle 10/31/2021   Depression, major, single episode, moderate ([place]) 10/31/2021   Lumbar disc disease with radiculopathy 08/01/2021   Scoliosis of lumbosacral spine 08/01/2021   Knock knee 08/01/2021   Neoplasm of uncertain behavior of skin of foot 08/01/2021   Vitamin D deficiency 08/01/2021   Vitamin B deficiency 08/01/2021   Type 2 diabetes mellitus with polyneuropathy ([place]) 07/24/2021   Peptic ulcer 07/24/2021   Situational stress 07/24/2021   Body mass index (BMI) of 40.1-44.9 in adult ([place]) 07/24/2021   PCP: Ronnell Freshwater, NP   REFERRING PROVIDER: Vanetta Mulders, MD   REFERRING DIAG: LBP, Right knee pain   THERAPY DIAG:  Chronic low back pain, unspecified back pain laterality, unspecified whether sciatica present   Chronic pain of right knee   ONSET DATE: years ago for back, recent for knee   SUBJECTIVE:                                                                                                                                                                                             SUBJECTIVE STATEMENT: Pt states the L hip is still bothering her and the L knee.    PERTINENT HISTORY:  DM2   PAIN:  PAIN:  Are you having pain? Yes: NPRS scale: 6/10 Pain location: R knee and L hip Pain description: achey Aggravating factors: being hard on it at work Relieving factors: ice, heat     PRECAUTIONS: None   PATIENT GOALS  decrease pain, better stretching/proper form, maintain at home     OBJECTIVE:  *Objective findings  taken at initial Evaluation, unless otherwise noted.    DIAGNOSTIC FINDINGS:  Pt reports arthritis found on xray   PATIENT SURVEYS:  Modified ODI 17/50 5/8: ODI 16/50                  SENSATION:          N/T in feet and hands on and off   MUSCLE LENGTH: 75% in bil HS with stretchy end feel   POSTURE:  Incr lumbar lordosis, Lt ASIS post rotated in supine   PALPATION: TTP bil SIJ- more on Left, Lt lateral hip TTP       LE MMT:   MMT Right 09/06/2021 Left 09/06/2021 Left / Right 5/8  Hip flexion       Hip extension       Hip abduction- sidelying at knee 43.3 47.3 33.9 / 28.3  Hip adduction       Hip internal rotation       Hip external rotation       Knee flexion       Knee extension       Ankle dorsiflexion       Ankle plantarflexion       Ankle inversion       Ankle eversion        (Blank rows = not tested)       GAIT: Bil genu valgus, Lt hip ant placed compared to Rt       TODAY'S TREATMENT   5/15 Mulligan belt mob lateral and inf grade III  Supine hip ABD GTB 5s 2x10 Bridge with ABD GTB at knees 2x10 Seated knee ext isometric 5s 2x10 bilat LAQ 3lbs 3x10 STS with GTB at knees 2x10 raised height table   5/15 Manual: STM to right gluteal; LAD with grade II and III oscillations left LE ; assessed knee cap ; Reviewed use of thera-cane for STM. Patient reports the tennis ball causes her extreme pain. She was shown the cane. She was advised she should be able to grade the pressure. She was advised to  put just enough pressure to where she can feel it. We will work on self desensitization over the next few weeks.   Leg press 50 lbs 3x10 with education on set Shoulder extension with abdominal breathing 3x10 10 lbs  Row 3x10 with cuing for breathing   Gluteal stretch 3x20 sec reviewed for home    5/8: MANUAL: STM to Left hip abd group Figure 4 stretch Clams & diag hip abduction bilaterally Qped- alt UE & alt LE ext progressed to bird dog Primal push ups Trialed 1/2 foam roll for decompression  09/26/21 Pt seen for aquatic therapy today.  Treatment took place in water 3.25-4 ft in depth at the [facility] pool. Temp of water was 96.  Pt entered/exited the pool via stairs independently with single rail.  Warm up: Forward and backward walking (without device)   Side stepping with core engaged Ab set kickboard push down (vertical position) 5 sec x 10; then with push / pull Ab set with shoulder ext to neutral x 10 with blue noodle Holding onto wall: hip ext x 10 each leg, Hip abdct x 10 x 2 Seated: Holding onto noodle: forward trunk flexion for back stretch x 10 sec, repeated 3 x, repeated to sides x 2 each.  Hip flexion/ knee flexion crossing midline x 10 each leg(relieves back); repeated into abdct x 10 each  Single KTC x 10 sec x 2 each  leg Fig 4 stretch / piriformis x 15 sec x 3 reps each leg on water bench Squats holding wall x 10 , cues to allow heels to come up (guarded) Repeated with yellow noodle between legs x 10 Bicycle with yellow dumbbells under water and yellow noodle between legs Supine float with nekdoodle, yellow noodle under arms, blue noodle under legs x 4 min for decompression.    * Sensitive skin rock tape applied to bilat knees in upside down horseshoe shape (lateral/med knee to tibial tuberosity) with 25% stretch to increase proprioception, decompress tissue. Safe removal instructions were given with verbalized understanding.   Pt requires buoyancy for  support and to offload joints with strengthening exercises. Viscosity of the water is needed for resistance of strengthening; water current perturbations provides challenge to standing balance unsupported, requiring increased core activation.   PATIENT EDUCATION:  Education details: exercise form/rationale, aquatics info, ktape infor Person educated: Patient Education method: Explanation, Demonstration, Tactile cues, Verbal cues,  Education comprehension: verbalized understanding, returned demonstration, verbal cues required, tactile cues required, and needs further education     HOME EXERCISE PROGRAM: [id number]   ASSESSMENT:   CLINICAL IMPRESSION: Pt able to continue with bilateral hip and knee extensor strengthening at this time. Pt had improvement in pain following lateral hip mobilization and was able to progress to isometric holds for reducing hip and knee pain. Plan to update HEP with more isometrics as able at next session. Pt appears largely strength limited at the hip and quads at this time- likely reduced functional capacity leading to hip and knee pain with daily standing job. Pt would benefit from continued skilled therapy in order to reach goals and maximize functional bilat LE strength for prevention of further functional decline..    OBJECTIVE IMPAIRMENTS decreased activity tolerance, decreased endurance, difficulty walking, decreased ROM, decreased strength, increased muscle spasms, impaired flexibility, improper body mechanics, postural dysfunction, obesity, and pain.       GOALS: Goals reviewed with patient? Yes   SHORT TERM GOALS:   STG Name Target Date Goal status  1 Able to demo proper abdominal contraction through exercises Baseline:  began educating at eval 09/29/21 achieved  2 Pt will demo proper hip hinge Baseline: requires education 09/29/21 achieved  3 Will establish if heel lift is appropriate to continue Baseline: will monitor 09/29/21 Achieved    LONG TERM  GOALS:    LTG Name Target Date Goal status  1 Able to tolerate necessary positions for work with knowledge of maintaining low pain levels Baseline: will educate and progress 12/30/21 ongoing  2 Pt will be independent in long term HEP for lumbopelvic stability and gross flexibility Baseline: will progress as appropriate 12/30/21 ongoing  3 Able to navigate stairs without increased pain Baseline: Right knee is fine, it's the left side that is a 7/10 11/04/21 ongoing  4 Pt will improve ODI score by Butler Baseline: see documentation 12/30/21 ongoing   5  Hip abd strength to norm of 45lb via dynamometry Baseline: see chart  12/30/21  new                      PLAN: PT FREQUENCY: 1-2x/week   PT DURATION: 8 weeks   PLANNED INTERVENTIONS: Therapeutic exercises, Therapeutic activity, Neuromuscular re-education, Balance training, Gait training, Patient/Family education, Joint manipulation, Joint mobilization, Stair training, Aquatic Therapy, Dry Needling, Electrical stimulation, Spinal mobilization, Cryotherapy, Moist heat, Taping, Traction, Manual therapy, and Neuro Muscular re-education   PLAN FOR NEXT SESSION: cont core/hip  gross strength   Carolyne Littles PT DPT  11/28/21 1:46 PM

## 2021-11-28 NOTE — ED Provider Notes (Addendum)
Allison Rose    CSN: [id number] Arrival date & time: 11/28/21  1451      History   Chief Complaint Chief Complaint  Patient presents with   Groin Pain    Extreme cramps, pain shoots to my back. No discharge but more clear moist. Menstrual cycle abnormal.  More blood clotting than bleeding. I have an ob-gyn appointment on September 11th, but I can no longer wait. - Entered by patient    HPI Allison Rose is a 41 y.o. female who presents with pelvic cramping x 3 months. Her March cycle came on time, but she only passed large clots and lasted  for 22 days. Had fatigue from all that and saw her new PCP 4/24,  but no test were ordered. She was referred to GYN, but her appointment is not til 03/2022. She was not advised to take any iron. Her ankles have been swelling since and are not going down. She is still fatigued. Denies new sexual partner since she separated from her Husband in December who was unfaithful to her. She had neg STD then, but is OK to repeat it today. Denies bowel changes, or constipation, or dysuria.  Has been advised to get a sleep study, but she never did.    History reviewed. No pertinent past medical history.  Patient Active Problem List   Diagnosis Date Noted   Menorrhagia with irregular cycle 10/31/2021   Depression, major, single episode, moderate ([place]) 10/31/2021   Lumbar disc disease with radiculopathy 08/01/2021   Scoliosis of lumbosacral spine 08/01/2021   Knock knee 08/01/2021   Neoplasm of uncertain behavior of skin of foot 08/01/2021   Vitamin D deficiency 08/01/2021   Vitamin B deficiency 08/01/2021   Type 2 diabetes mellitus with polyneuropathy ([place]) 07/24/2021   Peptic ulcer 07/24/2021   Situational stress 07/24/2021   Body mass index (BMI) of 40.1-44.9 in adult [facility]) 07/24/2021    Past Surgical History:  Procedure Laterality Date   GALLBLADDER SURGERY N/A 2016   TONSILLECTOMY N/A 2016   TUBAL LIGATION N/A 2011   tubial ligation N/A  2012    OB History   No obstetric history on file.      Home Medications    Prior to Admission medications   Medication Sig Start Date End Date Taking? Authorizing Provider  pantoprazole (PROTONIX) 40 MG tablet Take 1 tablet (40 mg total) by mouth daily. 07/18/21   Ronnell Freshwater, NP  Semaglutide,0.25 or 0.'5MG'$ /DOS, (OZEMPIC, 0.25 OR 0.5 MG/DOSE,) 2 MG/1.5ML SOPN Inject 0.'25mg'$  [place] weekly for 4 weeks. Then inject 0.'5mg'$  [place] weekly.  DX - E11.65 10/31/21   Ronnell Freshwater, NP    Family History Family History  Problem Relation Age of Onset   Alcoholism Mother    Depression Mother    Depression Father    Alcoholism Father    Depression Maternal Grandmother    Cancer Maternal Grandmother    Diabetes Maternal Grandmother    Diabetes Maternal Grandfather    Depression Maternal Grandfather    Cancer Maternal Grandfather     Social History Social History   Tobacco Use   Smoking status: Never   Smokeless tobacco: Never  Vaping Use   Vaping Use: Never used  Substance Use Topics   Alcohol use: Not Currently   Drug use: Yes    Types: Marijuana     Allergies   Metformin   Review of Systems Review of Systems  Constitutional:  Positive for unexpected weight change. Negative for  appetite change, chills, diaphoresis and fever.  Gastrointestinal:  Positive for abdominal pain. Negative for constipation and diarrhea.  Genitourinary:  Positive for pelvic pain and vaginal bleeding. Negative for dysuria, frequency and vaginal discharge.  Psychiatric/Behavioral:  Positive for sleep disturbance.        Snores    Physical Exam Triage Vital Signs ED Triage Vitals  Enc Vitals Group     BP 11/28/21 1524 140/86     Pulse Rate 11/28/21 1524 66     Resp 11/28/21 1524 18     Temp 11/28/21 1524 98.6 F (37 C)     Temp Source 11/28/21 1524 Oral     SpO2 11/28/21 1524 100 %     Weight --      Height --      Head Circumference --      Peak Flow --      Pain Score 11/28/21 1527 4      Pain Loc --      Pain Edu? --      Excl. in [place]? --    No data found.  Updated Vital Signs BP 140/86 (BP Location: Left Arm)   Pulse 66   Temp 98.6 F (37 C) (Oral)   Resp 18   LMP 11/07/2021   SpO2 100%   Visual Acuity Right Eye Distance:   Left Eye Distance:   Bilateral Distance:    Right Eye Near:   Left Eye Near:    Bilateral Near:      Physical Exam Vitals signs and nursing note reviewed.  Constitutional:      General: She is not in acute distress.    Appearance: Normal appearance. She is not ill-appearing, toxic-appearing or diaphoretic.  HENT:     Head: Normocephalic.     Right Ear: external ear normal.     Left Ear: external ear normal.     Nose: Nose normal.   Eyes:     General: No scleral icterus.       Right eye: No discharge.        Left eye: No discharge.     Conjunctiva/sclera: Conjunctivae normal.  Neck:     Musculoskeletal: Neck supple. No neck rigidity. R thyroid fullness noted.  Cardiovascular:     Rate and Rhythm: Normal rate and regular rhythm.     Heart sounds: No murmur. + 1/4 ankle pitting edema Pulmonary:     Effort: Pulmonary effort is normal.     Breath sounds: Normal breath sounds.  Abdominal:     General: Bowel sounds are normal. There is no distension.     Palpations: Abdomen is soft. There is no mass.     Tenderness: There is abdominal tenderness across lower abdomen. There is no guarding or rebound.     Hernia: No hernia is present.  Musculoskeletal: Normal range of motion.  Lymphadenopathy:     Cervical: No cervical adenopathy.  Skin:    General: Skin is warm and dry.     Coloration: Skin is not jaundiced.     Findings: No rash.  Neurological:     Mental Status: She is alert and oriented to person, place, and time.     Gait: Gait normal.  Psychiatric:        Mood and Affect: Mood normal.        Behavior: Behavior normal.        Thought Content: Thought content normal.        Judgment: Judgment normal.  UC  Treatments / Results  Labs (all labs ordered are listed, but only abnormal results are displayed) Labs Reviewed  CBC  TSH  T4, FREE  T3, FREE  COMPREHENSIVE METABOLIC PANEL  CERVICOVAGINAL ANCILLARY ONLY    EKG   Radiology No results found.  Procedures Procedures (including critical care time)  Medications Ordered in UC Medications - No data to display  Initial Impression / Assessment and Plan / UC Course  I have reviewed the triage vital signs and the nursing notes.  Chronic pelvic pain AUB  I ordered a pelvic ultrasound, CBC, CMP, thyroid panel, vaginal swab for STDs. We will inform her when the test come back. See instructions.      Final Clinical Impressions(s) / UC Diagnoses   Final diagnoses:  Uterine pain  Screen for STD (sexually transmitted disease)  Abnormal uterine bleeding (AUB)  Other fatigue  Multiple thyroid nodules  Edema, unspecified type  Snoring  Other insomnia     Discharge Instructions      We will inform you if your results are abnormal when they come back.  In the mean time I advise you start taking Fe 325 mg twice a day with a stool softer which both are over the counter.      ED Prescriptions   None    PDMP not reviewed this encounter.   Shelby Mattocks, PA-C 11/28/21 1709    Rodriguez-Southworth, [place], PA-C 11/28/21 1710    Rodriguez-Southworth, [place], [place] 11/28/21 1711

## 2021-11-28 NOTE — Discharge Instructions (Addendum)
We will inform you if your results are abnormal when they come back.  In the mean time I advise you start taking Fe 325 mg twice a day with a stool softer which both are over the counter.

## 2021-11-29 LAB — COMPREHENSIVE METABOLIC PANEL
ALT: 18 IU/L (ref 0–32)
AST: 18 IU/L (ref 0–40)
Albumin/Globulin Ratio: 1.5 (ref 1.2–2.2)
Albumin: 4.1 g/dL (ref 3.8–4.8)
Alkaline Phosphatase: 72 IU/L (ref 44–121)
BUN/Creatinine Ratio: 12 (ref 9–23)
BUN: 11 mg/dL (ref 6–20)
Bilirubin Total: <0.2 mg/dL (ref 0.0–1.2)
CO2: 21 mmol/L (ref 20–29)
Calcium: 9.3 mg/dL (ref 8.7–10.2)
Chloride: 107 mmol/L — ABNORMAL HIGH (ref 96–106)
Creatinine, Ser: 0.91 mg/dL (ref 0.57–1.00)
Globulin, Total: 2.8 g/dL (ref 1.5–4.5)
Glucose: 88 mg/dL (ref 70–99)
Potassium: 4 mmol/L (ref 3.5–5.2)
Sodium: 140 mmol/L (ref 134–144)
Total Protein: 6.9 g/dL (ref 6.0–8.5)
eGFR: 82 mL/min/{1.73_m2} (ref >59)

## 2021-11-29 LAB — CERVICOVAGINAL ANCILLARY ONLY
Bacterial Vaginitis (gardnerella): POSITIVE — AB
Candida Glabrata: NEGATIVE
Candida Vaginitis: NEGATIVE
Chlamydia: NEGATIVE
Comment: NEGATIVE
Comment: NEGATIVE
Comment: NEGATIVE
Comment: NEGATIVE
Comment: NEGATIVE
Comment: NORMAL
Neisseria Gonorrhea: NEGATIVE
Trichomonas: NEGATIVE

## 2021-11-29 LAB — CBC
Hematocrit: 39.2 % (ref 34.0–46.6)
Hemoglobin: 12.4 g/dL (ref 11.1–15.9)
MCH: 24.9 pg — ABNORMAL LOW (ref 26.6–33.0)
MCHC: 31.6 g/dL (ref 31.5–35.7)
MCV: 79 fL (ref 79–97)
Platelets: 304 10*3/uL (ref 150–450)
RBC: 4.97 x10E6/uL (ref 3.77–5.28)
RDW: 16.2 % — ABNORMAL HIGH (ref 11.7–15.4)
WBC: 9.1 10*3/uL (ref 3.4–10.8)

## 2021-11-29 LAB — T4, FREE: Free T4: 1.05 ng/dL (ref 0.82–1.77)

## 2021-11-29 LAB — T3, FREE: T3, Free: 2.5 pg/mL (ref 2.0–4.4)

## 2021-11-29 LAB — TSH: TSH: 1.12 u[IU]/mL (ref 0.450–4.500)

## 2021-11-30 ENCOUNTER — Telehealth (HOSPITAL_COMMUNITY): Payer: Self-pay | Admitting: Emergency Medicine

## 2021-11-30 MED ORDER — METRONIDAZOLE 500 MG PO TABS: 500.0000 mg | ORAL_TABLET | Freq: Two times a day (BID) | ORAL | 0 refills | Status: DC

## 2021-12-12 ENCOUNTER — Ambulatory Visit (HOSPITAL_BASED_OUTPATIENT_CLINIC_OR_DEPARTMENT_OTHER): Payer: BC Managed Care – PPO | Attending: Orthopaedic Surgery | Admitting: Physical Therapy

## 2021-12-12 ENCOUNTER — Encounter (HOSPITAL_BASED_OUTPATIENT_CLINIC_OR_DEPARTMENT_OTHER): Payer: Self-pay | Admitting: Physical Therapy

## 2021-12-12 DIAGNOSIS — M545 Low back pain, unspecified: Secondary | ICD-10-CM | POA: Insufficient documentation

## 2021-12-12 DIAGNOSIS — G8929 Other chronic pain: Secondary | ICD-10-CM | POA: Diagnosis not present

## 2021-12-12 DIAGNOSIS — M25561 Pain in right knee: Secondary | ICD-10-CM | POA: Insufficient documentation

## 2021-12-12 NOTE — Therapy (Signed)
OUTPATIENT PHYSICAL THERAPY TREATMENT NOTE/Re-evaluation   Patient Name: Allison Rose MRN: [id number] DOB:[birth year], 41 y.o., female Today's Date: 12/12/2021  PCP: Ronnell Freshwater, NP REFERRING PROVIDER: Vanetta Mulders, MD   PT End of Session - 12/12/21 1338     Visit Number 8    Number of Visits 17    Date for PT Re-Evaluation 12/30/21    Authorization Type [employer] MCD    PT Start Time 1100    PT Stop Time 1143    PT Time Calculation (min) 43 min    Activity Tolerance Patient tolerated treatment well    Behavior During Therapy [facility] for tasks assessed/performed                   History reviewed. No pertinent past medical history. Past Surgical History:  Procedure Laterality Date   GALLBLADDER SURGERY N/A 2016   TONSILLECTOMY N/A 2016   TUBAL LIGATION N/A 2011   tubial ligation N/A 2012   Patient Active Problem List   Diagnosis Date Noted   Menorrhagia with irregular cycle 10/31/2021   Depression, major, single episode, moderate ([place]) 10/31/2021   Lumbar disc disease with radiculopathy 08/01/2021   Scoliosis of lumbosacral spine 08/01/2021   Knock knee 08/01/2021   Neoplasm of uncertain behavior of skin of foot 08/01/2021   Vitamin D deficiency 08/01/2021   Vitamin B deficiency 08/01/2021   Type 2 diabetes mellitus with polyneuropathy ([place]) 07/24/2021   Peptic ulcer 07/24/2021   Situational stress 07/24/2021   Body mass index (BMI) of 40.1-44.9 in adult ([place]) 07/24/2021   PCP: Ronnell Freshwater, NP   REFERRING PROVIDER: Vanetta Mulders, MD   REFERRING DIAG: LBP, Right knee pain   THERAPY DIAG:  Chronic low back pain, unspecified back pain laterality, unspecified whether sciatica present   Chronic pain of right knee   ONSET DATE: years ago for back, recent for knee   SUBJECTIVE:                                                                                                                                                                                             SUBJECTIVE STATEMENT: Pt states the L hip pain is overall improved, with mild pain at beginning of session today. She has been able to better tolerate standing and work demands without an increase in pain. Pt has not been able to maintain HEP consistently due to time constraints but wants to get back in the gym and improve fitness.    PERTINENT HISTORY:  DM2   PAIN:  PAIN:  Are you having pain? Yes: NPRS scale: 6/10 6/5 Pain location: R  knee and L hip Pain description: achey Aggravating factors: being hard on it at work Relieving factors: ice, heat     PRECAUTIONS: None   PATIENT GOALS  decrease pain, better stretching/proper form, maintain at home     OBJECTIVE:  *Objective findings taken at initial Evaluation, unless otherwise noted.    DIAGNOSTIC FINDINGS:  Pt reports arthritis found on xray   PATIENT SURVEYS:  Modified ODI 17/50 5/8: ODI 16/50                  SENSATION:          N/T in feet and hands on and off   MUSCLE LENGTH: 75% in bil HS with stretchy end feel   POSTURE:  Incr lumbar lordosis, Lt ASIS post rotated in supine   PALPATION: TTP bil SIJ- more on Left, Lt lateral hip TTP       LE MMT:   MMT Right 09/06/2021 Left 09/06/2021 Left / Right 5/8  Hip flexion       Hip extension       Hip abduction- sidelying at knee 43.3 47.3 33.9 / 28.3  Hip adduction       Hip internal rotation       Hip external rotation       Knee flexion       Knee extension       Ankle dorsiflexion       Ankle plantarflexion       Ankle inversion       Ankle eversion        (Blank rows = not tested)       GAIT: Bil genu valgus, Lt hip ant placed compared to Rt       TODAY'S TREATMENT   6/5  Skilled TrP palpation and dry needling of glute max/med Trigger Point Dry-Needling  Treatment instructions: Expect mild to moderate muscle soreness. S/S of pneumothorax if dry needled over a lung field, and to seek immediate medical attention should they  occur. Patient verbalized understanding of these instructions and education.  Patient Consent Given: Yes Education handout provided: Previously provided Muscles treated: Skilled TrP palpation and dry needling of glute max/med Electrical stimulation performed: No Parameters: N/A Treatment response/outcome: great switch on the 2nd and 3rd needle   LAD of L LE in hooklying Piriformis stretch seated 3x20 second hold  Hamstring stretch seated 3x20 sec hold  Banded hip abd seated 2x10 red band  Standing hip extension 2x10    5/15 [place] belt mob lateral and inf grade III  Supine hip ABD GTB 5s 2x10 Bridge with ABD GTB at knees 2x10 Seated knee ext isometric 5s 2x10 bilat LAQ 3lbs 3x10 STS with GTB at knees 2x10 raised height table   5/15 Manual: STM to right gluteal; LAD with grade II and III oscillations left LE ; assessed knee cap ; Reviewed use of thera-cane for STM. Patient reports the tennis ball causes her extreme pain. She was shown the cane. She was advised she should be able to grade the pressure. She was advised to put just enough pressure to where she can feel it. We will work on self desensitization over the next few weeks.   Leg press 50 lbs 3x10 with education on set Shoulder extension with abdominal breathing 3x10 10 lbs  Row 3x10 with cuing for breathing   Gluteal stretch 3x20 sec reviewed for home    5/8: MANUAL: STM to Left hip abd group Figure 4 stretch Clams & diag  hip abduction bilaterally Qped- alt UE & alt LE ext progressed to bird dog Primal push ups Trialed 1/2 foam roll for decompression  09/26/21 Pt seen for aquatic therapy today.  Treatment took place in water 3.25-4 ft in depth at the [facility] pool. Temp of water was 96.  Pt entered/exited the pool via stairs independently with single rail.  Warm up: Forward and backward walking (without device)   Side stepping with core engaged Ab set kickboard push down (vertical position) 5  sec x 10; then with push / pull Ab set with shoulder ext to neutral x 10 with blue noodle Holding onto wall: hip ext x 10 each leg, Hip abdct x 10 x 2 Seated: Holding onto noodle: forward trunk flexion for back stretch x 10 sec, repeated 3 x, repeated to sides x 2 each.  Hip flexion/ knee flexion crossing midline x 10 each leg(relieves back); repeated into abdct x 10 each  Single KTC x 10 sec x 2 each leg Fig 4 stretch / piriformis x 15 sec x 3 reps each leg on water bench Squats holding wall x 10 , cues to allow heels to come up (guarded) Repeated with yellow noodle between legs x 10 Bicycle with yellow dumbbells under water and yellow noodle between legs Supine float with nekdoodle, yellow noodle under arms, blue noodle under legs x 4 min for decompression.    * Sensitive skin rock tape applied to bilat knees in upside down horseshoe shape (lateral/med knee to tibial tuberosity) with 25% stretch to increase proprioception, decompress tissue. Safe removal instructions were given with verbalized understanding.   Pt requires buoyancy for support and to offload joints with strengthening exercises. Viscosity of the water is needed for resistance of strengthening; water current perturbations provides challenge to standing balance unsupported, requiring increased core activation.   PATIENT EDUCATION:  Education details: exercise form/rationale, aquatics info, ktape infor Person educated: Patient Education method: Explanation, Demonstration, Tactile cues, Verbal cues,  Education comprehension: verbalized understanding, returned demonstration, verbal cues required, tactile cues required, and needs further education     HOME EXERCISE PROGRAM: [id number]   ASSESSMENT:   CLINICAL IMPRESSION: The patient trialed TPDN to the left gluteal. She had a great twitch with her second and third needle. Overall she is making progress. She is having less pain at work, but she is still having pain. She admits  she has not been as consistent as she should with her HEP but she is working long hours. Therapy reviewed stretches that may be easier for her to do on her work. She tolerated well. Therapy will progress as tolerated.    OBJECTIVE IMPAIRMENTS decreased activity tolerance, decreased endurance, difficulty walking, decreased ROM, decreased strength, increased muscle spasms, impaired flexibility, improper body mechanics, postural dysfunction, obesity, and pain.       GOALS: Goals reviewed with patient? Yes   SHORT TERM GOALS:   STG Name Target Date Goal status  1 Able to demo proper abdominal contraction through exercises Baseline:  began educating at eval 09/29/21 achieved  2 Pt will demo proper hip hinge Baseline: requires education 09/29/21 achieved  3 Will establish if heel lift is appropriate to continue Baseline: will monitor 09/29/21 Achieved    LONG TERM GOALS:    LTG Name Target Date Goal status  1 Able to tolerate necessary positions for work with knowledge of maintaining low pain levels Baseline: will educate and progress 12/30/21 ongoing  2 Pt will be independent in long term HEP for lumbopelvic  stability and gross flexibility Baseline: will progress as appropriate 12/30/21 ongoing  3 Able to navigate stairs without increased pain Baseline: Right knee is fine, it's the left side that is a 7/10 11/04/21 ongoing  4 Pt will improve ODI score by [place] Baseline: see documentation 12/30/21 ongoing   5  Hip abd strength to norm of 45lb via dynamometry Baseline: see chart  12/30/21  new                      PLAN: PT FREQUENCY: 1-2x/week   PT DURATION: 8 weeks   PLANNED INTERVENTIONS: Therapeutic exercises, Therapeutic activity, Neuromuscular re-education, Balance training, Gait training, Patient/Family education, Joint manipulation, Joint mobilization, Stair training, Aquatic Therapy, Dry Needling, Electrical stimulation, Spinal mobilization, Cryotherapy, Moist heat, Taping, Traction,  Manual therapy, and Neuro Muscular re-education   PLAN FOR NEXT SESSION: cont core/hip gross strength   Carolyne Littles PT DPT  12/12/21 1:41 PM

## 2021-12-19 ENCOUNTER — Ambulatory Visit (HOSPITAL_BASED_OUTPATIENT_CLINIC_OR_DEPARTMENT_OTHER): Payer: BC Managed Care – PPO | Admitting: Physical Therapy

## 2021-12-19 ENCOUNTER — Encounter (HOSPITAL_BASED_OUTPATIENT_CLINIC_OR_DEPARTMENT_OTHER): Payer: Self-pay | Admitting: Physical Therapy

## 2021-12-19 DIAGNOSIS — G8929 Other chronic pain: Secondary | ICD-10-CM

## 2021-12-19 DIAGNOSIS — M25561 Pain in right knee: Secondary | ICD-10-CM | POA: Diagnosis not present

## 2021-12-19 DIAGNOSIS — M545 Low back pain, unspecified: Secondary | ICD-10-CM | POA: Diagnosis not present

## 2021-12-19 NOTE — Therapy (Signed)
OUTPATIENT PHYSICAL THERAPY TREATMENT NOTE/Re-evaluation   Patient Name: Allison Rose MRN: [id number] DOB:1980/09/11, 41 y.o., female Today's Date: 12/19/2021  PCP: Ronnell Freshwater, NP REFERRING PROVIDER: Vanetta Mulders, MD      No past medical history on file. Past Surgical History:  Procedure Laterality Date   GALLBLADDER SURGERY N/A 2016   TONSILLECTOMY N/A 2016   TUBAL LIGATION N/A 2011   tubial ligation N/A 2012   Patient Active Problem List   Diagnosis Date Noted   Menorrhagia with irregular cycle 10/31/2021   Depression, major, single episode, moderate ([place]) 10/31/2021   Lumbar disc disease with radiculopathy 08/01/2021   Scoliosis of lumbosacral spine 08/01/2021   Knock knee 08/01/2021   Neoplasm of uncertain behavior of skin of foot 08/01/2021   Vitamin D deficiency 08/01/2021   Vitamin B deficiency 08/01/2021   Type 2 diabetes mellitus with polyneuropathy ([place]) 07/24/2021   Peptic ulcer 07/24/2021   Situational stress 07/24/2021   Body mass index (BMI) of 40.1-44.9 in adult ([place]) 07/24/2021   PCP: Ronnell Freshwater, NP   REFERRING PROVIDER: Vanetta Mulders, MD   REFERRING DIAG: LBP, Right knee pain   THERAPY DIAG:  Chronic low back pain, unspecified back pain laterality, unspecified whether sciatica present   Chronic pain of right knee   ONSET DATE: years ago for back, recent for knee   SUBJECTIVE:                                                                                                                                                                                            SUBJECTIVE STATEMENT: Patient has made great progress and reports feeling great following dry needling - back and L hip pain have greatly improved and she is able to tolerate longer periods of sitting and standing. Pt states R low back and hip have felt more stiff since last session. Pt wants to return to independent exercise program.   PERTINENT HISTORY:  DM2   PAIN:   PAIN:  Are you having pain? Yes: NPRS scale: 6/10 6/5 Pain location: R knee and L hip Pain description: achey Aggravating factors: being hard on it at work Relieving factors: ice, heat     PRECAUTIONS: None   PATIENT GOALS  decrease pain, better stretching/proper form, maintain at home     OBJECTIVE:  *Objective findings taken at initial Evaluation, unless otherwise noted.    DIAGNOSTIC FINDINGS:  Pt reports arthritis found on xray   PATIENT SURVEYS:  Modified ODI 17/50 5/8: ODI 16/50                  SENSATION:  N/T in feet and hands on and off   MUSCLE LENGTH: 75% in bil HS with stretchy end feel   POSTURE:  Incr lumbar lordosis, Lt ASIS post rotated in supine   PALPATION: TTP bil SIJ- more on Left, Lt lateral hip TTP       LE MMT:   MMT Right 09/06/2021 Left 09/06/2021 Left / Right 5/8  Hip flexion       Hip extension       Hip abduction- sidelying at knee 43.3 47.3 33.9 / 28.3  Hip adduction       Hip internal rotation       Hip external rotation       Knee flexion       Knee extension       Ankle dorsiflexion       Ankle plantarflexion       Ankle inversion       Ankle eversion        (Blank rows = not tested)       GAIT: Bil genu valgus, Lt hip ant placed compared to Rt       TODAY'S TREATMENT   6/12 Skilled TrP palpation and dry needling of glute max/med Trigger Point Dry-Needling  Treatment instructions: Expect mild to moderate muscle soreness. S/S of pneumothorax if dry needled over a lung field, and to seek immediate medical attention should they occur. Patient verbalized understanding of these instructions and education.  Patient Consent Given: Yes Education handout provided: Previously provided Muscles treated: Skilled TrP palpation and dry needling of glute max/med Electrical stimulation performed: No Parameters: N/A Treatment response/outcome: Good localized twitch response   Ant/post pelvic tilt x15 Bridge 2x10,  cues for core stability Banded hip abd seated 2x10 GTB Standing hip extension 2x10 GTB Paloff press 2x10 GTB   6/5  Skilled TrP palpation and dry needling of glute max/med Trigger Point Dry-Needling  Treatment instructions: Expect mild to moderate muscle soreness. S/S of pneumothorax if dry needled over a lung field, and to seek immediate medical attention should they occur. Patient verbalized understanding of these instructions and education.  Patient Consent Given: Yes Education handout provided: Previously provided Muscles treated: Skilled TrP palpation and dry needling of glute max/med Electrical stimulation performed: No Parameters: N/A Treatment response/outcome: great switch on the 2nd and 3rd needle   LAD of L LE in hooklying Piriformis stretch seated 3x20 second hold  Hamstring stretch seated 3x20 sec hold  Banded hip abd seated 2x10 red band  Standing hip extension 2x10    5/15 Mulligan belt mob lateral and inf grade III  Supine hip ABD GTB 5s 2x10 Bridge with ABD GTB at knees 2x10 Seated knee ext isometric 5s 2x10 bilat LAQ 3lbs 3x10 STS with GTB at knees 2x10 raised height table   5/15 Manual: STM to right gluteal; LAD with grade II and III oscillations left LE ; assessed knee cap ; Reviewed use of thera-cane for STM. Patient reports the tennis ball causes her extreme pain. She was shown the cane. She was advised she should be able to grade the pressure. She was advised to put just enough pressure to where she can feel it. We will work on self desensitization over the next few weeks.   Leg press 50 lbs 3x10 with education on set Shoulder extension with abdominal breathing 3x10 10 lbs  Row 3x10 with cuing for breathing   Gluteal stretch 3x20 sec reviewed for home    5/8: MANUAL: STM to Left hip  abd group Figure 4 stretch Clams & diag hip abduction bilaterally Qped- alt UE & alt LE ext progressed to bird dog Primal push ups Trialed 1/2 foam roll for  decompression  09/26/21 Pt seen for aquatic therapy today.  Treatment took place in water 3.25-4 ft in depth at the [facility] pool. Temp of water was 96.  Pt entered/exited the pool via stairs independently with single rail.  Warm up: Forward and backward walking (without device)   Side stepping with core engaged Ab set kickboard push down (vertical position) 5 sec x 10; then with push / pull Ab set with shoulder ext to neutral x 10 with blue noodle Holding onto wall: hip ext x 10 each leg, Hip abdct x 10 x 2 Seated: Holding onto noodle: forward trunk flexion for back stretch x 10 sec, repeated 3 x, repeated to sides x 2 each.  Hip flexion/ knee flexion crossing midline x 10 each leg(relieves back); repeated into abdct x 10 each  Single KTC x 10 sec x 2 each leg Fig 4 stretch / piriformis x 15 sec x 3 reps each leg on water bench Squats holding wall x 10 , cues to allow heels to come up (guarded) Repeated with yellow noodle between legs x 10 Bicycle with yellow dumbbells under water and yellow noodle between legs Supine float with nekdoodle, yellow noodle under arms, blue noodle under legs x 4 min for decompression.    * Sensitive skin rock tape applied to bilat knees in upside down horseshoe shape (lateral/med knee to tibial tuberosity) with 25% stretch to increase proprioception, decompress tissue. Safe removal instructions were given with verbalized understanding.   Pt requires buoyancy for support and to offload joints with strengthening exercises. Viscosity of the water is needed for resistance of strengthening; water current perturbations provides challenge to standing balance unsupported, requiring increased core activation.   PATIENT EDUCATION:  Education details: exercise form/rationale, aquatics info, ktape infor Person educated: Patient Education method: Explanation, Demonstration, Tactile cues, Verbal cues,  Education comprehension: verbalized understanding,  returned demonstration, verbal cues required, tactile cues required, and needs further education     HOME EXERCISE PROGRAM: [id number]   ASSESSMENT:   CLINICAL IMPRESSION: Patient reports an excellent response following TPDN session prior and similarly had good twitch response to TPDN today. She is making good progress with pain and function and was able to progress exercises today. Pt was given education on importance of core stability and making a gym routine. Pt will continue to benefit from therapy to further improve strength, stability, and tolerance to functional and occupational activities. She may want to join [facility].   OBJECTIVE IMPAIRMENTS decreased activity tolerance, decreased endurance, difficulty walking, decreased ROM, decreased strength, increased muscle spasms, impaired flexibility, improper body mechanics, postural dysfunction, obesity, and pain.       GOALS: Goals reviewed with patient? Yes   SHORT TERM GOALS:   STG Name Target Date Goal status  1 Able to demo proper abdominal contraction through exercises Baseline:  began educating at eval 09/29/21 achieved  2 Pt will demo proper hip hinge Baseline: requires education 09/29/21 achieved  3 Will establish if heel lift is appropriate to continue Baseline: will monitor 09/29/21 Achieved    LONG TERM GOALS:    LTG Name Target Date Goal status  1 Able to tolerate necessary positions for work with knowledge of maintaining low pain levels Baseline: will educate and progress 12/30/21 ongoing  2 Pt will be independent in long term HEP for  lumbopelvic stability and gross flexibility Baseline: will progress as appropriate 12/30/21 ongoing  3 Able to navigate stairs without increased pain Baseline: Right knee is fine, it's the left side that is a 7/10 11/04/21 ongoing  4 Pt will improve ODI score by MDC Baseline: see documentation 12/30/21 ongoing   5  Hip abd strength to norm of 45lb via dynamometry Baseline: see chart   12/30/21  new                      PLAN: PT FREQUENCY: 1-2x/week   PT DURATION: 8 weeks   PLANNED INTERVENTIONS: Therapeutic exercises, Therapeutic activity, Neuromuscular re-education, Balance training, Gait training, Patient/Family education, Joint manipulation, Joint mobilization, Stair training, Aquatic Therapy, Dry Needling, Electrical stimulation, Spinal mobilization, Cryotherapy, Moist heat, Taping, Traction, Manual therapy, and Neuro Muscular re-education   PLAN FOR NEXT SESSION: Continue/progress core stability and hip strength exercises.   Carolyne Littles PT DPT  12/19/21 11:38 AM

## 2021-12-26 ENCOUNTER — Ambulatory Visit (HOSPITAL_BASED_OUTPATIENT_CLINIC_OR_DEPARTMENT_OTHER): Payer: BC Managed Care – PPO | Admitting: Physical Therapy

## 2022-01-02 ENCOUNTER — Ambulatory Visit (HOSPITAL_BASED_OUTPATIENT_CLINIC_OR_DEPARTMENT_OTHER): Payer: BC Managed Care – PPO | Admitting: Physical Therapy

## 2022-01-02 ENCOUNTER — Encounter (HOSPITAL_BASED_OUTPATIENT_CLINIC_OR_DEPARTMENT_OTHER): Payer: Self-pay | Admitting: Physical Therapy

## 2022-01-02 DIAGNOSIS — M25561 Pain in right knee: Secondary | ICD-10-CM | POA: Diagnosis not present

## 2022-01-02 DIAGNOSIS — G8929 Other chronic pain: Secondary | ICD-10-CM

## 2022-01-02 DIAGNOSIS — M545 Low back pain, unspecified: Secondary | ICD-10-CM | POA: Diagnosis not present

## 2022-01-02 NOTE — Therapy (Addendum)
OUTPATIENT PHYSICAL THERAPY TREATMENT NOTE/Re-evaluation   Patient Name: Latresa Gasser MRN: [id number] DOB:1981/01/28, 41 y.o., female Today's Date: 01/02/2022  PCP: Ronnell Freshwater, NP REFERRING PROVIDER: Vanetta Mulders, MD   PT End of Session - 01/02/22 1212     Visit Number 10    Number of Visits 17    Date for PT Re-Evaluation 02/13/22    Authorization Type [employer] MCD    PT Start Time 0930    PT Stop Time 1009    PT Time Calculation (min) 39 min    Activity Tolerance Patient tolerated treatment well    Behavior During Therapy [facility] for tasks assessed/performed               History reviewed. No pertinent past medical history. Past Surgical History:  Procedure Laterality Date   GALLBLADDER SURGERY N/A 2016   TONSILLECTOMY N/A 2016   TUBAL LIGATION N/A 2011   tubial ligation N/A 2012   Patient Active Problem List   Diagnosis Date Noted   Menorrhagia with irregular cycle 10/31/2021   Depression, major, single episode, moderate ([place]) 10/31/2021   Lumbar disc disease with radiculopathy 08/01/2021   Scoliosis of lumbosacral spine 08/01/2021   Knock knee 08/01/2021   Neoplasm of uncertain behavior of skin of foot 08/01/2021   Vitamin D deficiency 08/01/2021   Vitamin B deficiency 08/01/2021   Type 2 diabetes mellitus with polyneuropathy ([place]) 07/24/2021   Peptic ulcer 07/24/2021   Situational stress 07/24/2021   Body mass index (BMI) of 40.1-44.9 in adult ([place]) 07/24/2021   PCP: Ronnell Freshwater, NP   REFERRING PROVIDER: Vanetta Mulders, MD   REFERRING DIAG: LBP, Right knee pain   THERAPY DIAG:  Chronic low back pain, unspecified back pain laterality, unspecified whether sciatica present   Chronic pain of right knee   ONSET DATE: years ago for back, recent for knee   SUBJECTIVE:                                                                                                                                                                                             SUBJECTIVE STATEMENT: The patient had a significant increase in pain in her right hip and back over the last week. She reports one day she had to be carired out of work the pain was so severe. She is having difficulty walking and feels like her hip is swollen.   PERTINENT HISTORY:  DM2   PAIN:  PAIN:  Are you having pain? Yes: NPRS scale: 11/10 in her right hip feels like it is swollen 6/10 in the right Pain location: R knee and L  hip Pain description: achey Aggravating factors: being hard on it at work Relieving factors: ice, heat     PRECAUTIONS: None   PATIENT GOALS  decrease pain, better stretching/proper form, maintain at home     OBJECTIVE:  *Objective findings taken at initial Evaluation, unless otherwise noted.    DIAGNOSTIC FINDINGS:  Pt reports arthritis found on xray   PATIENT SURVEYS:  Modified ODI 17/50 5/8: ODI 16/50                  SENSATION:          N/T in feet and hands on and off   MUSCLE LENGTH: 75% in bil HS with stretchy end feel   POSTURE:  Incr lumbar lordosis, Lt ASIS post rotated in supine   PALPATION: TTP bil SIJ- more on Left, Lt lateral hip TTP       LE MMT:   MMT Right 09/06/2021 Left 09/06/2021 Left / Right 5/8  Hip flexion       Hip extension       Hip abduction- sidelying at knee 43.3 47.3 33.9 / 28.3  Hip adduction       Hip internal rotation       Hip external rotation       Knee flexion       Knee extension       Ankle dorsiflexion       Ankle plantarflexion       Ankle inversion       Ankle eversion        (Blank rows = not tested)       GAIT: Bil genu valgus, Lt hip ant placed compared to Rt       TODAY'S TREATMENT   6/26 Trigger Point Dry-Needling  Treatment instructions: Expect mild to moderate muscle soreness. S/S of pneumothorax if dry needled over a lung field, and to seek immediate medical attention should they occur. Patient verbalized understanding of these instructions and  education.  Patient Consent Given: Yes Education handout provided: Previously provided Muscles treated: Skilled TrP palpation and dry needling of glute max/med Electrical stimulation performed: No Parameters: N/A Treatment response/outcome: had to stop because the patient became diaphoretic/ needling ended   Roller to right hip in side lying ; significant pain around the right trochanter; skilled palpation of trigger points LAD grade 1 and II oscillations to reduce pain. No pain reduction noted  Skilled palpation of trigger points   LTR X20 in pain free ranges; limited ranges noted  Reviewed glute stretch for home but patient unable to get into position   Ball roll out straight 2x10  Lateral ball roll 2x10 each side. Patient reported a reduction in pain with movement.       6/12 Skilled TrP palpation and dry needling of glute max/med Trigger Point Dry-Needling  Treatment instructions: Expect mild to moderate muscle soreness. S/S of pneumothorax if dry needled over a lung field, and to seek immediate medical attention should they occur. Patient verbalized understanding of these instructions and education.  Patient Consent Given: Yes Education handout provided: Previously provided Muscles treated: Skilled TrP palpation and dry needling of glute max/med Electrical stimulation performed: No Parameters: N/A Treatment response/outcome: Good localized twitch response   Ant/post pelvic tilt x15 Bridge 2x10, cues for core stability Banded hip abd seated 2x10 GTB Standing hip extension 2x10 GTB Paloff press 2x10 GTB   6/5  Skilled TrP palpation and dry needling of glute max/med Trigger Point Dry-Needling  Treatment instructions:  Expect mild to moderate muscle soreness. S/S of pneumothorax if dry needled over a lung field, and to seek immediate medical attention should they occur. Patient verbalized understanding of these instructions and education.  Patient Consent Given:  Yes Education handout provided: Previously provided Muscles treated: Skilled TrP palpation and dry needling of glute max/med Electrical stimulation performed: No Parameters: N/A Treatment response/outcome: great switch on the 2nd and 3rd needle   LAD of L LE in hooklying Piriformis stretch seated 3x20 second hold  Hamstring stretch seated 3x20 sec hold  Banded hip abd seated 2x10 red band  Standing hip extension 2x10    5/15 Mulligan belt mob lateral and inf grade III  Supine hip ABD GTB 5s 2x10 Bridge with ABD GTB at knees 2x10 Seated knee ext isometric 5s 2x10 bilat LAQ 3lbs 3x10 STS with GTB at knees 2x10 raised height table     PATIENT EDUCATION:  Education details: contact MD  Person educated: Patient Education method: Explanation, Demonstration, Tactile cues, Verbal cues,  Education comprehension: verbalized understanding, returned demonstration, verbal cues required, tactile cues required, and needs further education     HOME EXERCISE PROGRAM: [id number]   ASSESSMENT:   CLINICAL IMPRESSION: The patient thad low tolerance for activity today. She had increased pain with light soft tissue and light LAD designed for pain relief. She had difficulty getting into positions on the table. She was advised that an exacerbation is not unuausal but having 10/10 pain is. We attempted needling but she was unable to tolerate. She did not have any significant improvement with manual work. We attempted light movement exercises with a mild improvement. She was advised, that if PT helped today to call and schedule more therapy. She was otherwise advised to go back for further follow up 2nd to severe pain She showed signs of significant pain today with bed mobility. Sh required assistance to come from supine to sit. Prior to this last week she was making great progress. She had no pain and was going back to exercises. She had no MOI for exacerbation. We will extend her POC for 6 more weeks  1W6.    OBJECTIVE IMPAIRMENTS decreased activity tolerance, decreased endurance, difficulty walking, decreased ROM, decreased strength, increased muscle spasms, impaired flexibility, improper body mechanics, postural dysfunction, obesity, and pain.       GOALS: Goals reviewed with patient? Yes   SHORT TERM GOALS:   STG Name Target Date Goal status  1 Able to demo proper abdominal contraction through exercises Baseline:  began educating at eval 09/29/21 achieved  2 Pt will demo proper hip hinge Baseline: requires education 09/29/21 achieved  3 Will establish if heel lift is appropriate to continue Baseline: will monitor 09/29/21 Achieved    LONG TERM GOALS:    LTG Name Target Date Goal status  1 Able to tolerate necessary positions for work with knowledge of maintaining low pain levels Baseline: will educate and progress 12/30/21 ongoing  2 Pt will be independent in long term HEP for lumbopelvic stability and gross flexibility Baseline: will progress as appropriate 12/30/21 ongoing  3 Able to navigate stairs without increased pain Baseline: Right knee is fine, it's the left side that is a 7/10 11/04/21 ongoing  4 Pt will improve ODI score by MDC Baseline: see documentation 12/30/21 ongoing   5  Hip abd strength to norm of 45lb via dynamometry Baseline: see chart  12/30/21  new  PLAN: PT FREQUENCY: 1-2x/week   PT DURATION: 8 weeks   PLANNED INTERVENTIONS: Therapeutic exercises, Therapeutic activity, Neuromuscular re-education, Balance training, Gait training, Patient/Family education, Joint manipulation, Joint mobilization, Stair training, Aquatic Therapy, Dry Needling, Electrical stimulation, Spinal mobilization, Cryotherapy, Moist heat, Taping, Traction, Manual therapy, and Neuro Muscular re-education   PLAN FOR NEXT SESSION: Continue/progress core stability and hip strength exercises. PHYSICAL THERAPY DISCHARGE SUMMARY  Visits from Start of Care:  10  Current functional level related to goals / functional outcomes: On last visit had severe exacerbation    Remaining deficits: See above   Education / Equipment: HEP    Patient agrees to discharge. Patient goals were met. Patient is being discharged due to  sent back MD .   Carolyne Littles PT DPT  01/02/22 1:11 PM

## 2022-01-06 ENCOUNTER — Telehealth: Payer: Self-pay | Admitting: Nurse Practitioner

## 2022-01-06 ENCOUNTER — Other Ambulatory Visit: Payer: Self-pay

## 2022-01-06 DIAGNOSIS — K279 Peptic ulcer, site unspecified, unspecified as acute or chronic, without hemorrhage or perforation: Secondary | ICD-10-CM

## 2022-01-06 MED ORDER — PANTOPRAZOLE SODIUM 40 MG PO TBEC: 40.0000 mg | DELAYED_RELEASE_TABLET | Freq: Every day | ORAL | 0 refills | Status: DC

## 2022-01-06 NOTE — Telephone Encounter (Signed)
Rx was sent  

## 2022-01-06 NOTE — Telephone Encounter (Signed)
Patient requesting refill of Pantoprazole. Please advise.

## 2022-02-08 ENCOUNTER — Telehealth: Payer: Self-pay | Admitting: Nurse Practitioner

## 2022-02-08 NOTE — Telephone Encounter (Signed)
Patient called and stated she was supposed to get a referral for sleep study but hasn't heard anything back. Can you check on this?

## 2022-02-08 NOTE — Telephone Encounter (Signed)
We may have talked about it, but I definitely did not do the referral. In last note, I have them following up sometime in July. I don't see an appointment made. We can discuss this further at next visit once scheduled. Thanks.

## 2022-02-09 NOTE — Telephone Encounter (Signed)
Patient aware.

## 2022-02-19 ENCOUNTER — Other Ambulatory Visit: Payer: Self-pay | Admitting: Nurse Practitioner

## 2022-02-19 DIAGNOSIS — K279 Peptic ulcer, site unspecified, unspecified as acute or chronic, without hemorrhage or perforation: Secondary | ICD-10-CM

## 2022-02-26 NOTE — Progress Notes (Unsigned)
Established patient visit   Patient: Allison Rose   DOB: 07/18/1980   41 y.o. Female  MRN: [id number] Visit Date: 02/27/2022   No chief complaint on file.  Subjective    HPI  Routine follow up.  -type 2 diabetes.  --recently changed victoza to [place] -patient wanting to disucss referral for sleep study.    Medications: Outpatient Medications Prior to Visit  Medication Sig   metroNIDAZOLE (FLAGYL) 500 MG tablet Take 1 tablet (500 mg total) by mouth 2 (two) times daily.   pantoprazole (PROTONIX) 40 MG tablet TAKE 1 TABLET(40 MG) BY MOUTH DAILY   Semaglutide,0.25 or 0.'5MG'$ /DOS, (OZEMPIC, 0.25 OR 0.5 MG/DOSE,) 2 MG/1.5ML SOPN Inject 0.'25mg'$  [place] weekly for 4 weeks. Then inject 0.'5mg'$  [place] weekly.  DX - E11.65   No facility-administered medications prior to visit.    Review of Systems  {Labs (Optional):23779}   Objective    There were no vitals taken for this visit. BP Readings from Last 3 Encounters:  11/28/21 140/86  10/31/21 127/78  08/15/21 115/63    Wt Readings from Last 3 Encounters:  10/31/21 242 lb 6.4 oz (110 kg)  08/01/21 245 lb 1.9 oz (111.2 kg)  07/18/21 246 lb 6.4 oz (111.8 kg)    Physical Exam  ***  No results found for any visits on 02/27/22.  Assessment & Plan     Problem List Items Addressed This Visit   None    No follow-ups on file.         Ronnell Freshwater, NP  [facility] [phone] (phone) [phone] (fax)  [place]

## 2022-02-27 ENCOUNTER — Ambulatory Visit (INDEPENDENT_AMBULATORY_CARE_PROVIDER_SITE_OTHER): Payer: BC Managed Care – PPO | Admitting: Nurse Practitioner

## 2022-02-27 ENCOUNTER — Encounter: Payer: Self-pay | Admitting: Nurse Practitioner

## 2022-02-27 VITALS — BP 127/76 | HR 77 | Ht 64.96 in | Wt 243.0 lb

## 2022-02-27 DIAGNOSIS — E1142 Type 2 diabetes mellitus with diabetic polyneuropathy: Secondary | ICD-10-CM | POA: Diagnosis not present

## 2022-02-27 DIAGNOSIS — G4719 Other hypersomnia: Secondary | ICD-10-CM | POA: Diagnosis not present

## 2022-02-27 DIAGNOSIS — K279 Peptic ulcer, site unspecified, unspecified as acute or chronic, without hemorrhage or perforation: Secondary | ICD-10-CM

## 2022-02-27 DIAGNOSIS — R0683 Snoring: Secondary | ICD-10-CM

## 2022-02-27 DIAGNOSIS — J309 Allergic rhinitis, unspecified: Secondary | ICD-10-CM | POA: Insufficient documentation

## 2022-02-27 DIAGNOSIS — N938 Other specified abnormal uterine and vaginal bleeding: Secondary | ICD-10-CM | POA: Insufficient documentation

## 2022-02-27 MED ORDER — PANTOPRAZOLE SODIUM 40 MG PO TBEC: DELAYED_RELEASE_TABLET | ORAL | 1 refills | Status: DC

## 2022-02-27 MED ORDER — CETIRIZINE HCL 10 MG PO TABS: 10.0000 mg | ORAL_TABLET | Freq: Every day | ORAL | 1 refills | Status: DC

## 2022-02-27 MED ORDER — NORETHINDRONE ACET-ETHINYL EST 1-20 MG-MCG PO TABS: 1.0000 | ORAL_TABLET | Freq: Every day | ORAL | 11 refills | Status: DC

## 2022-02-27 MED ORDER — OZEMPIC (0.25 OR 0.5 MG/DOSE) 2 MG/1.5ML ~~LOC~~ SOPN: PEN_INJECTOR | SUBCUTANEOUS | 3 refills | Status: DC

## 2022-03-20 ENCOUNTER — Other Ambulatory Visit (HOSPITAL_COMMUNITY)
Admission: RE | Admit: 2022-03-20 | Discharge: 2022-03-20 | Disposition: A | Discharge status: Home / Self Care | Payer: BC Managed Care – PPO | Source: Ambulatory Visit | Attending: Obstetrics & Gynecology | Admitting: Obstetrics & Gynecology

## 2022-03-20 ENCOUNTER — Encounter (HOSPITAL_BASED_OUTPATIENT_CLINIC_OR_DEPARTMENT_OTHER): Payer: Self-pay | Admitting: Obstetrics & Gynecology

## 2022-03-20 ENCOUNTER — Ambulatory Visit (INDEPENDENT_AMBULATORY_CARE_PROVIDER_SITE_OTHER): Payer: BC Managed Care – PPO | Admitting: Obstetrics & Gynecology

## 2022-03-20 VITALS — BP 140/92 | HR 84 | Ht 65.5 in | Wt 243.4 lb

## 2022-03-20 DIAGNOSIS — N898 Other specified noninflammatory disorders of vagina: Secondary | ICD-10-CM | POA: Insufficient documentation

## 2022-03-20 DIAGNOSIS — Z124 Encounter for screening for malignant neoplasm of cervix: Secondary | ICD-10-CM | POA: Diagnosis not present

## 2022-03-20 DIAGNOSIS — N921 Excessive and frequent menstruation with irregular cycle: Secondary | ICD-10-CM | POA: Diagnosis not present

## 2022-03-20 DIAGNOSIS — B3731 Acute candidiasis of vulva and vagina: Secondary | ICD-10-CM | POA: Insufficient documentation

## 2022-03-20 DIAGNOSIS — Z113 Encounter for screening for infections with a predominantly sexual mode of transmission: Secondary | ICD-10-CM | POA: Insufficient documentation

## 2022-03-20 MED ORDER — NORETHINDRONE 0.35 MG PO TABS: 1.0000 | ORAL_TABLET | Freq: Every day | ORAL | 3 refills | Status: DC

## 2022-03-20 NOTE — Progress Notes (Signed)
41 y.o. G41P2A1 Divorced AA female here in referral from Leretha Pol, NP, due to irregular bleeding and menorrhagia.  Pt reports hx of endometriosis diagnosed around age 41 due to pelvic pain.  Pt reports cycles started to change this year in February.  She started having worse pain where she had bleeding that lasted for 22 days.  Since then, cycles have been heavy with passage of clots.  She has had cycles each month but not regular.  She called Nira Conn last month and was started on OCPs.  BP is elevated today.  Initial BP was 152/105.  After resting, repeat was 140/92.  Advised pt she needs to stop OCPs today.  Will switch to POPs.  Cycle started today but bleeding is better.  Now having a lot of cramping and low back pain.    Patient's last menstrual period was 02/24/2022.          Sexually active: Yes.    The current method of family planning is tubal ligation.    Smoker:  no  Health Maintenance: Pap:  unsure of last one but thinks maybe last year History of abnormal Pap:  no MMG:  guidelines reviewed Colonoscopy:  guidelines reviewed    reports that she has never smoked. She has never used smokeless tobacco. She reports that she does not currently use alcohol. She reports current drug use. Drug: Marijuana.  No past medical history on file.  Past Surgical History:  Procedure Laterality Date   GALLBLADDER SURGERY N/A 2016   TONSILLECTOMY N/A 2016   TUBAL LIGATION N/A 2011   tubial ligation N/A 2012    Current Outpatient Medications  Medication Sig Dispense Refill   cetirizine (ZYRTEC) 10 MG tablet Take 1 tablet (10 mg total) by mouth daily. 90 tablet 1   norethindrone-ethinyl estradiol (LOESTRIN 1/20, 21,) 1-20 MG-MCG tablet Take 1 tablet by mouth daily. 28 tablet 11   pantoprazole (PROTONIX) 40 MG tablet TAKE 1 TABLET(40 MG) BY MOUTH DAILY 90 tablet 1   Semaglutide,0.25 or 0.'5MG'$ /DOS, (OZEMPIC, 0.25 OR 0.5 MG/DOSE,) 2 MG/1.5ML SOPN Inject 0.'25mg'$  [place] weekly for 4 weeks. Then inject  0.'5mg'$  [place] weekly.  DX - E11.65 3 mL 3   metroNIDAZOLE (FLAGYL) 500 MG tablet Take 1 tablet (500 mg total) by mouth 2 (two) times daily. (Patient not taking: Reported on 03/20/2022) 14 tablet 0   No current facility-administered medications for this visit.    Family History  Problem Relation Age of Onset   Alcoholism Mother    Depression Mother    Depression Father    Alcoholism Father    Depression Maternal Grandmother    Cancer Maternal Grandmother    Diabetes Maternal Grandmother    Diabetes Maternal Grandfather    Depression Maternal Grandfather    Cancer Maternal Grandfather     ROS: Constitutional: negative Genitourinary: positive for irregular bleeding, low back pain, cramping  Exam:   BP (!) 151/105 (BP Location: Left Arm, Patient Position: Sitting, Cuff Size: Large)   Pulse 84   Ht 5' 5.5" (1.664 m) Comment: Reported  Wt 243 lb 6.4 oz (110.4 kg)   LMP 02/24/2022   BMI 39.89 kg/m   Height: 5' 5.5" (166.4 cm) (Reported)  General appearance: alert, cooperative and appears stated age Head: Normocephalic, without obvious abnormality, atraumatic Abdomen: soft, non-tender; bowel sounds normal; no masses,  no organomegaly Extremities: extremities normal, atraumatic, no cyanosis or edema Skin: Skin color, texture, turgor normal. No rashes or lesions Lymph nodes: Cervical, supraclavicular, and axillary nodes  normal. No abnormal inguinal nodes palpated Neurologic: Grossly normal   Pelvic: External genitalia:  no lesions              Urethra:  normal appearing urethra with no masses, tenderness or lesions              Bartholins and Skenes: normal                 Vagina: normal appearing vagina with normal color and no discharge, no lesions              Cervix: no lesions              Pap taken: Yes.   Bimanual Exam:  Uterus:  normal size, contour, position, consistency, mobility, non-tender              Adnexa: normal adnexa and no mass, fullness, tenderness                Rectovaginal: Confirms               Anus:  normal sphincter tone, no lesions  Chaperone, Octaviano Batty, CMA, was present for exam.  Assessment/Plan: 1. Menorrhagia with irregular cycle - will stop combination OCPs due to elevated blood pressure - norethindrone (MICRONOR) 0.35 MG tablet; Take 1 tablet (0.35 mg total) by mouth daily.  Dispense: 28 tablet; Refill: 3 - CBC - Iron, TIBC and Ferritin Panel - US PELVIC COMPLETE WITH TRANSVAGINAL; Future - Follicle stimulating hormone  2. Cervical cancer screening - Cytology - PAP( [place])  3. Vaginal itching - Cervicovaginal ancillary only( [place])  4. Screen for STD (sexually transmitted disease) - Cervicovaginal ancillary only( [place]) - RPR+HBsAg+HIV - Hepatitis C antibody

## 2022-03-21 LAB — RPR+HBSAG+HIV
HIV Screen 4th Generation wRfx: NONREACTIVE
Hepatitis B Surface Ag: NEGATIVE
RPR Ser Ql: NONREACTIVE

## 2022-03-21 LAB — CBC
Hematocrit: 37.8 % (ref 34.0–46.6)
Hemoglobin: 11.8 g/dL (ref 11.1–15.9)
MCH: 24.3 pg — ABNORMAL LOW (ref 26.6–33.0)
MCHC: 31.2 g/dL — ABNORMAL LOW (ref 31.5–35.7)
MCV: 78 fL — ABNORMAL LOW (ref 79–97)
Platelets: 307 10*3/uL (ref 150–450)
RBC: 4.85 x10E6/uL (ref 3.77–5.28)
RDW: 16.7 % — ABNORMAL HIGH (ref 11.7–15.4)
WBC: 8.2 10*3/uL (ref 3.4–10.8)

## 2022-03-21 LAB — CERVICOVAGINAL ANCILLARY ONLY
Bacterial Vaginitis (gardnerella): NEGATIVE
Candida Glabrata: NEGATIVE
Candida Vaginitis: POSITIVE — AB
Chlamydia: NEGATIVE
Comment: NEGATIVE
Comment: NEGATIVE
Comment: NEGATIVE
Comment: NEGATIVE
Comment: NEGATIVE
Comment: NORMAL
Neisseria Gonorrhea: NEGATIVE
Trichomonas: NEGATIVE

## 2022-03-21 LAB — IRON,TIBC AND FERRITIN PANEL
Ferritin: 12 ng/mL — ABNORMAL LOW (ref 15–150)
Iron Saturation: 12 % — ABNORMAL LOW (ref 15–55)
Iron: 48 ug/dL (ref 27–159)
Total Iron Binding Capacity: 408 ug/dL (ref 250–450)
UIBC: 360 ug/dL (ref 131–425)

## 2022-03-21 LAB — FOLLICLE STIMULATING HORMONE: FSH: 6.8 m[IU]/mL

## 2022-03-21 LAB — HEPATITIS C ANTIBODY: Hep C Virus Ab: NONREACTIVE

## 2022-03-21 MED ORDER — FLUCONAZOLE 150 MG PO TABS: 150.0000 mg | ORAL_TABLET | Freq: Once | ORAL | 0 refills | Status: AC

## 2022-03-21 NOTE — Addendum Note (Signed)
Addended by: Megan Salon on: 03/21/2022 03:21 PM   Modules accepted: Orders

## 2022-03-24 LAB — CYTOLOGY - PAP
Comment: NEGATIVE
Diagnosis: NEGATIVE
High risk HPV: NEGATIVE

## 2022-03-25 DIAGNOSIS — E119 Type 2 diabetes mellitus without complications: Secondary | ICD-10-CM | POA: Diagnosis not present

## 2022-03-25 DIAGNOSIS — H52223 Regular astigmatism, bilateral: Secondary | ICD-10-CM | POA: Diagnosis not present

## 2022-03-26 ENCOUNTER — Ambulatory Visit
Admission: EM | Admit: 2022-03-26 | Discharge: 2022-03-26 | Disposition: A | Discharge status: Home / Self Care | Payer: BC Managed Care – PPO | Attending: Urgent Care | Admitting: Urgent Care

## 2022-03-26 ENCOUNTER — Other Ambulatory Visit: Payer: Self-pay

## 2022-03-26 ENCOUNTER — Ambulatory Visit (HOSPITAL_BASED_OUTPATIENT_CLINIC_OR_DEPARTMENT_OTHER)
Admission: RE | Admit: 2022-03-26 | Discharge: 2022-03-26 | Disposition: A | Discharge status: Home / Self Care | Payer: BC Managed Care – PPO | Source: Ambulatory Visit | Attending: Obstetrics & Gynecology | Admitting: Obstetrics & Gynecology

## 2022-03-26 DIAGNOSIS — R079 Chest pain, unspecified: Secondary | ICD-10-CM | POA: Diagnosis not present

## 2022-03-26 DIAGNOSIS — N926 Irregular menstruation, unspecified: Secondary | ICD-10-CM | POA: Diagnosis not present

## 2022-03-26 DIAGNOSIS — D259 Leiomyoma of uterus, unspecified: Secondary | ICD-10-CM | POA: Diagnosis not present

## 2022-03-26 DIAGNOSIS — N921 Excessive and frequent menstruation with irregular cycle: Secondary | ICD-10-CM

## 2022-03-26 DIAGNOSIS — N920 Excessive and frequent menstruation with regular cycle: Secondary | ICD-10-CM | POA: Diagnosis not present

## 2022-03-26 NOTE — Discharge Instructions (Addendum)
Advised patient to proceed to ED for evaluation of chest pain.

## 2022-03-26 NOTE — ED Triage Notes (Signed)
Patient presents to chest pain x 2 weeks and bilateral arm pain x 2 days. Pt states chest pain located at center of chest and radiates to her left arm. Reports she has been under a lot of stress lately and has hx of anxiety does not take any medications.   Denies n/v or SOB.

## 2022-03-26 NOTE — ED Notes (Signed)
Patient is being discharged from the Urgent Care and sent to the Emergency Department via personal vehicle . Per Annie Main, NP, patient is in need of higher level of care due to chest pain requiring work-up. Patient is aware and verbalizes understanding of plan of care.  Vitals:   03/26/22 1441  BP: 133/84  Pulse: 66  Resp: 16  Temp: 97.9 F (36.6 C)  SpO2: 98%

## 2022-03-26 NOTE — ED Provider Notes (Signed)
[name]    CSN: [id number] Arrival date & time: 03/26/22  1424      History   Chief Complaint Chief Complaint  Patient presents with   Chest Pain   Arm Injury    HPI Allison Rose is a 41 y.o. female.    Chest Pain Arm Injury   Patient presented as a walk-in to urgent care with complaint of chest pain x2 weeks she came today because she developed left arm pain as an associated symptom.  She denies diaphoresis.  Denies shortness of breath with exertion.  Her symptoms are not exacerbated by exertion.  She has no nausea.  Endorses history of anxiety and has no treatment.  History reviewed. No pertinent past medical history.  Patient Active Problem List   Diagnosis Date Noted   Excessive daytime sleepiness 02/27/2022   Loud snoring 02/27/2022   Chronic allergic rhinitis 02/27/2022   Dysfunctional uterine bleeding 02/27/2022   Menorrhagia with irregular cycle 10/31/2021   Depression, major, single episode, moderate ([place]) 10/31/2021   Lumbar disc disease with radiculopathy 08/01/2021   Scoliosis of lumbosacral spine 08/01/2021   Knock knee 08/01/2021   Neoplasm of uncertain behavior of skin of foot 08/01/2021   Vitamin D deficiency 08/01/2021   Vitamin B deficiency 08/01/2021   Type 2 diabetes mellitus with polyneuropathy ([place]) 07/24/2021   Peptic ulcer 07/24/2021   Situational stress 07/24/2021   Body mass index (BMI) of 40.1-44.9 in adult [facility]) 07/24/2021   Myopia, bilateral 03/25/2020   Bartholin gland cyst 07/03/2019   Elevated liver function tests 12/12/2017   Essential hypertension 12/12/2017   Gastroesophageal reflux disease 12/12/2017   Hemorrhoids 10/18/2017    Past Surgical History:  Procedure Laterality Date   DIAGNOSTIC LAPAROSCOPY  2004   diagnosted with endometriosis   GALLBLADDER SURGERY N/A 2016   SALPINGECTOMY Bilateral    had pain with Filshie clip so tubes were removed   TONSILLECTOMY N/A 2016   TUBAL LIGATION N/A 2011    OB  History     Gravida  3   Para  2   Term  2   Preterm      AB  1   Living         SAB  1   IAB      Ectopic      Multiple      Live Births               Home Medications    Prior to Admission medications   Medication Sig Start Date End Date Taking? Authorizing Provider  cetirizine (ZYRTEC) 10 MG tablet Take 1 tablet (10 mg total) by mouth daily. 02/27/22   Ronnell Freshwater, NP  norethindrone (MICRONOR) 0.35 MG tablet Take 1 tablet (0.35 mg total) by mouth daily. 03/20/22   Megan Salon, MD  pantoprazole (PROTONIX) 40 MG tablet TAKE 1 TABLET(40 MG) BY MOUTH DAILY 02/27/22   Ronnell Freshwater, NP  Semaglutide,0.25 or 0.'5MG'$ /DOS, (OZEMPIC, 0.25 OR 0.5 MG/DOSE,) 2 MG/1.5ML SOPN Inject 0.'25mg'$  [place] weekly for 4 weeks. Then inject 0.'5mg'$  [place] weekly.  DX - E11.65 02/27/22   Ronnell Freshwater, NP    Family History Family History  Problem Relation Age of Onset   Alcoholism Mother    Depression Mother    Depression Father    Alcoholism Father    Depression Maternal Grandmother    Diabetes Maternal Grandmother    Breast cancer Maternal Grandmother  in her 60's   Diabetes Maternal Grandfather    Depression Maternal Grandfather    Colon cancer Maternal Grandfather    Colon cancer Paternal Grandfather     Social History Social History   Tobacco Use   Smoking status: Never   Smokeless tobacco: Never  Vaping Use   Vaping Use: Never used  Substance Use Topics   Alcohol use: Not Currently   Drug use: Yes    Types: Marijuana     Allergies   Metformin   Review of Systems Review of Systems  Cardiovascular:  Positive for chest pain.     Physical Exam Triage Vital Signs ED Triage Vitals [03/26/22 1441]  Enc Vitals Group     BP      Pulse Rate 66     Resp 16     Temp 97.9 F (36.6 C)     Temp src      SpO2 98 %     Weight      Height      Head Circumference      Peak Flow      Pain Score 6     Pain Loc      Pain Edu?      Excl. in [place]?     No data found.  Updated Vital Signs Pulse 66   Temp 97.9 F (36.6 C)   Resp 16   LMP 03/19/2022   SpO2 98%   Visual Acuity Right Eye Distance:   Left Eye Distance:   Bilateral Distance:    Right Eye Near:   Left Eye Near:    Bilateral Near:     Physical Exam Vitals reviewed.  Constitutional:      Appearance: She is well-developed.  Cardiovascular:     Rate and Rhythm: Normal rate and regular rhythm.  Musculoskeletal:     Cervical back: Normal range of motion and neck supple.  Skin:    General: Skin is warm and dry.  Neurological:     General: No focal deficit present.     Mental Status: She is alert and oriented to person, place, and time.  Psychiatric:        Mood and Affect: Mood normal.        Behavior: Behavior normal.      UC Treatments / Results  Labs (all labs ordered are listed, but only abnormal results are displayed) Labs Reviewed - No data to display  EKG   Radiology No results found.  Procedures Procedures (including critical care time)  Medications Ordered in UC Medications - No data to display  Initial Impression / Assessment and Plan / UC Course  I have reviewed the triage vital signs and the nursing notes.  Pertinent labs & imaging results that were available during my care of the patient were reviewed by me and considered in my medical decision making (see chart for details).   Physical exam exam and ECG are reassuring.  Concern for her chest pain given new left arm pain today.  Advised patient that we could not positively rule out acute coronary syndrome without troponin which cannot be drawn in urgent care.  Recommended she proceed to ED for evaluation.   Final Clinical Impressions(s) / UC Diagnoses   Final diagnoses:  None   Discharge Instructions   None    ED Prescriptions   None    PDMP not reviewed this encounter.   Rose Phi, [place] 03/26/22 1456

## 2022-03-30 ENCOUNTER — Ambulatory Visit (HOSPITAL_BASED_OUTPATIENT_CLINIC_OR_DEPARTMENT_OTHER): Payer: BC Managed Care – PPO | Admitting: Obstetrics & Gynecology

## 2022-03-30 ENCOUNTER — Encounter (HOSPITAL_BASED_OUTPATIENT_CLINIC_OR_DEPARTMENT_OTHER): Payer: Self-pay | Admitting: Obstetrics & Gynecology

## 2022-04-17 ENCOUNTER — Ambulatory Visit: Payer: BC Managed Care – PPO | Admitting: Neurology

## 2022-04-17 ENCOUNTER — Encounter: Payer: Self-pay | Admitting: Neurology

## 2022-04-17 VITALS — BP 125/86 | HR 72 | Ht 65.5 in | Wt 244.4 lb

## 2022-04-17 DIAGNOSIS — R0683 Snoring: Secondary | ICD-10-CM

## 2022-04-17 DIAGNOSIS — G4719 Other hypersomnia: Secondary | ICD-10-CM | POA: Diagnosis not present

## 2022-04-17 DIAGNOSIS — R0681 Apnea, not elsewhere classified: Secondary | ICD-10-CM | POA: Diagnosis not present

## 2022-04-17 DIAGNOSIS — G4730 Sleep apnea, unspecified: Secondary | ICD-10-CM

## 2022-04-17 DIAGNOSIS — R351 Nocturia: Secondary | ICD-10-CM

## 2022-04-17 DIAGNOSIS — R519 Headache, unspecified: Secondary | ICD-10-CM

## 2022-04-17 NOTE — Patient Instructions (Signed)
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
## 2022-04-18 ENCOUNTER — Ambulatory Visit (HOSPITAL_BASED_OUTPATIENT_CLINIC_OR_DEPARTMENT_OTHER): Payer: BC Managed Care – PPO | Admitting: Obstetrics & Gynecology

## 2022-05-22 ENCOUNTER — Ambulatory Visit (INDEPENDENT_AMBULATORY_CARE_PROVIDER_SITE_OTHER): Payer: BC Managed Care – PPO | Admitting: Family Medicine

## 2022-05-22 ENCOUNTER — Encounter: Payer: Self-pay | Admitting: Family Medicine

## 2022-05-22 VITALS — BP 142/83 | HR 73 | Wt 245.0 lb

## 2022-05-22 DIAGNOSIS — N809 Endometriosis, unspecified: Secondary | ICD-10-CM

## 2022-05-22 DIAGNOSIS — N921 Excessive and frequent menstruation with irregular cycle: Secondary | ICD-10-CM

## 2022-05-22 MED ORDER — ONDANSETRON 4 MG PO TBDP
4.0000 mg | ORAL_TABLET | Freq: Four times a day (QID) | ORAL | 3 refills | Status: DC | PRN
Start: 2022-05-22 — End: 2022-12-13

## 2022-05-22 MED ORDER — ORILISSA 150 MG PO TABS: 150.0000 mg | ORAL_TABLET | Freq: Every day | ORAL | 11 refills | Status: DC

## 2022-05-22 NOTE — Progress Notes (Unsigned)
GYN Transfer from [facility] here to F/U on bleeding and iron per Dr.Millers notes.  Pt was placed on Miconor and iron. Still taking,  Pt states AUB just stared back last night   LMP:05/10/22-05/14/22   Notes Pain that is 10/10x describes pain as severe cramps as if in labor.

## 2022-05-22 NOTE — Progress Notes (Unsigned)
   GYNECOLOGY PROBLEM  VISIT ENCOUNTER NOTE  Subjective:   Allison Rose is a 41 y.o. G51P2010 female here for a problem GYN visit.  Current complaints: irregular bleeding.   Denies abnormal vaginal bleeding, discharge, pelvic pain, problems with intercourse or other gynecologic concerns.    Gynecologic History No LMP recorded. (Menstrual status: Irregular Periods).  Contraception: tubal ligation  Health Maintenance Due  Topic Date Due   FOOT EXAM  Never done   OPHTHALMOLOGY EXAM  Never done   COVID-19 Vaccine (2 - Janssen risk series) 10/18/2019   INFLUENZA VACCINE  02/07/2022   HEMOGLOBIN A1C  05/02/2022    The following portions of the patient's history were reviewed and updated as appropriate: allergies, current medications, past family history, past medical history, past social history, past surgical history and problem list.  Review of Systems Pertinent items are noted in HPI.   Objective:  BP (!) 142/83   Pulse 73   Wt 245 lb (111.1 kg)   BMI 40.15 kg/m  Gen: well appearing, NAD HEENT: no scleral icterus CV: RR Lung: Normal WOB Ext: warm well perfused   Assessment and Plan:  1. Menorrhagia with irregular cycle ***   Please refer to After Visit Summary for other counseling recommendations.   No follow-ups on file.  Caren Macadam, MD, MPH, ABFM Attending [place] for [facility]

## 2022-05-24 ENCOUNTER — Encounter: Payer: Self-pay | Admitting: Family Medicine

## 2022-05-26 ENCOUNTER — Other Ambulatory Visit: Payer: Self-pay

## 2022-05-26 DIAGNOSIS — K279 Peptic ulcer, site unspecified, unspecified as acute or chronic, without hemorrhage or perforation: Secondary | ICD-10-CM

## 2022-05-26 MED ORDER — PANTOPRAZOLE SODIUM 40 MG PO TBEC: DELAYED_RELEASE_TABLET | ORAL | 1 refills | Status: DC

## 2022-06-08 ENCOUNTER — Telehealth: Payer: Self-pay | Admitting: Neurology

## 2022-06-08 NOTE — Telephone Encounter (Signed)
Sent mychart message

## 2022-06-12 NOTE — Telephone Encounter (Signed)
[facility] community pending uploaded notes

## 2022-06-26 ENCOUNTER — Ambulatory Visit: Payer: BC Managed Care – PPO

## 2022-06-26 ENCOUNTER — Encounter: Payer: Self-pay | Admitting: Family Medicine

## 2022-06-26 NOTE — Telephone Encounter (Signed)
06/15/22 [employer] team no Josem Kaufmann req ref # [id number]   06/13/22 [facility] community auth: [id number] (exp. 06/12/22 to 09/07/22)   Claiborne Billings LVM on 06/22/22.

## 2022-07-17 ENCOUNTER — Ambulatory Visit (HOSPITAL_BASED_OUTPATIENT_CLINIC_OR_DEPARTMENT_OTHER): Payer: BC Managed Care – PPO | Admitting: Obstetrics & Gynecology

## 2022-09-02 ENCOUNTER — Ambulatory Visit
Admission: EM | Admit: 2022-09-02 | Discharge: 2022-09-02 | Disposition: A | Discharge status: Home / Self Care | Payer: Medicaid Other

## 2022-09-02 DIAGNOSIS — H60501 Unspecified acute noninfective otitis externa, right ear: Secondary | ICD-10-CM | POA: Diagnosis not present

## 2022-09-02 MED ORDER — OFLOXACIN 0.3 % OT SOLN: 10.0000 [drp] | Freq: Every day | OTIC | 0 refills | Status: DC

## 2022-09-02 NOTE — Discharge Instructions (Addendum)
Use the ear drops as directed.  Follow up with your primary care provider if your symptoms are not improving.

## 2022-09-02 NOTE — ED Triage Notes (Signed)
Patient to Urgent Care with complaints of right sided ear pain. States pain radiates down her neck. Denies any known fevers.   Symptoms started three days ago.

## 2022-09-02 NOTE — ED Provider Notes (Signed)
[name]    CSN: [id number] Arrival date & time: 09/02/22  0857      History   Chief Complaint Chief Complaint  Patient presents with   Otalgia    HPI Allison Rose is a 41 y.o. female.  Patient presents with right ear pain x 3 days.  Her ear is painful to move and touch.  She has some ear drainage.  No fever, sore throat, cough, shortness of breath, vomiting, diarrhea, or other symptoms.  Treatment at home with Tylenol.  The history is provided by the patient and medical records.    Past Medical History:  Diagnosis Date   Allergies    Chronic allergic rhinitis    Cyst of Bartholin's gland    Daytime sleepiness    Diabetes (HCC)    DUB (dysfunctional uterine bleeding)    GERD (gastroesophageal reflux disease)    Hemorrhoids    History of depression    Hypertension    Knock knee    Lumbar disc disease    scoliosis   Menorrhagia    Neuropathy    Peptic ulcer    Snoring    Vitamin B deficiency    Vitamin D deficiency     Patient Active Problem List   Diagnosis Date Noted   Loud snoring 02/27/2022   Chronic allergic rhinitis 02/27/2022   Menorrhagia with irregular cycle 10/31/2021   Depression, major, single episode, moderate ([place]) 10/31/2021   Lumbar disc disease with radiculopathy 08/01/2021   Scoliosis of lumbosacral spine 08/01/2021   Knock knee 08/01/2021   Neoplasm of uncertain behavior of skin of foot 08/01/2021   Vitamin D deficiency 08/01/2021   Type 2 diabetes mellitus with polyneuropathy ([place]) 07/24/2021   Peptic ulcer 07/24/2021   Body mass index (BMI) of 40.1-44.9 in adult [facility]) 07/24/2021   Myopia, bilateral 03/25/2020   Essential hypertension 12/12/2017   Gastroesophageal reflux disease 12/12/2017   Hemorrhoids 10/18/2017    Past Surgical History:  Procedure Laterality Date   DIAGNOSTIC LAPAROSCOPY  2004   diagnosted with endometriosis   GALLBLADDER SURGERY N/A 2016   SALPINGECTOMY Bilateral    had pain with Filshie clip so  tubes were removed   TONSILLECTOMY N/A 2016   TUBAL LIGATION N/A 2011    OB History     Gravida  3   Para  2   Term  2   Preterm      AB  1   Living         SAB  1   IAB      Ectopic      Multiple      Live Births               Home Medications    Prior to Admission medications   Medication Sig Start Date End Date Taking? Authorizing Provider  ofloxacin (FLOXIN) 0.3 % OTIC solution Place 10 drops into the right ear daily. 09/02/22  Yes Sharion Balloon, NP  cetirizine (ZYRTEC) 10 MG tablet Take 1 tablet (10 mg total) by mouth daily. 02/27/22   Ronnell Freshwater, NP  Elagolix Sodium (ORILISSA) 150 MG TABS Take 150 mg by mouth daily. 05/22/22   Caren Macadam, MD  norethindrone (MICRONOR) 0.35 MG tablet Take 1 tablet by mouth daily. Patient not taking: Reported on 09/02/2022 05/23/22   [provider]  ondansetron (ZOFRAN-ODT) 4 MG disintegrating tablet Take 1 tablet (4 mg total) by mouth every 6 (six) hours as needed for nausea.  05/22/22   Caren Macadam, MD  pantoprazole (PROTONIX) 40 MG tablet TAKE 1 TABLET(40 MG) BY MOUTH DAILY 05/26/22   Ronnell Freshwater, NP    Family History Family History  Problem Relation Age of Onset   Alcoholism Mother    Depression Mother    Depression Father    Alcoholism Father    Depression Sister    Bipolar disorder Sister    Depression Maternal Grandmother    Diabetes Maternal Grandmother    Breast cancer Maternal Grandmother        in her 46's   Diabetes Maternal Grandfather    Depression Maternal Grandfather    Colon cancer Maternal Grandfather    Colon cancer Paternal Grandfather     Social History Social History   Tobacco Use   Smoking status: Never   Smokeless tobacco: Never  Vaping Use   Vaping Use: Never used  Substance Use Topics   Alcohol use: Not Currently   Drug use: Yes    Types: Marijuana     Allergies   Metformin   Review of Systems Review of Systems   Constitutional:  Negative for chills and fever.  HENT:  Positive for ear discharge and ear pain. Negative for sore throat.   Respiratory:  Negative for cough and shortness of breath.   Cardiovascular:  Negative for chest pain and palpitations.  Gastrointestinal:  Negative for diarrhea and vomiting.  Skin:  Negative for color change and rash.  All other systems reviewed and are negative.    Physical Exam Triage Vital Signs ED Triage Vitals  Enc Vitals Group     BP 09/02/22 0952 135/85     Pulse Rate 09/02/22 0949 70     Resp 09/02/22 0949 18     Temp 09/02/22 0949 98.8 F (37.1 C)     Temp src --      SpO2 09/02/22 0949 96 %     Weight --      Height --      Head Circumference --      Peak Flow --      Pain Score 09/02/22 0947 7     Pain Loc --      Pain Edu? --      Excl. in [place]? --    No data found.  Updated Vital Signs BP 135/85   Pulse 70   Temp 98.8 F (37.1 C)   Resp 18   LMP 08/02/2022   SpO2 96%   Visual Acuity Right Eye Distance:   Left Eye Distance:   Bilateral Distance:    Right Eye Near:   Left Eye Near:    Bilateral Near:     Physical Exam Vitals and nursing note reviewed.  Constitutional:      General: She is not in acute distress.    Appearance: She is well-developed. She is not ill-appearing.  HENT:     Left Ear: Tympanic membrane and ear canal normal.     Ears:     Comments: Right ear tender with movement. Moderate purulent drainage in canal. Unable to visualize TM.      Nose: Nose normal.     Mouth/Throat:     Mouth: Mucous membranes are moist.     Pharynx: Oropharynx is clear.  Cardiovascular:     Rate and Rhythm: Normal rate and regular rhythm.     Heart sounds: Normal heart sounds.  Pulmonary:     Effort: Pulmonary effort is normal. No respiratory distress.  Breath sounds: Normal breath sounds.  Musculoskeletal:     Cervical back: Neck supple.  Skin:    General: Skin is warm and dry.  Neurological:     Mental Status:  She is alert.  Psychiatric:        Mood and Affect: Mood normal.        Behavior: Behavior normal.      UC Treatments / Results  Labs (all labs ordered are listed, but only abnormal results are displayed) Labs Reviewed - No data to display  EKG   Radiology No results found.  Procedures Procedures (including critical care time)  Medications Ordered in UC Medications - No data to display  Initial Impression / Assessment and Plan / UC Course  I have reviewed the triage vital signs and the nursing notes.  Pertinent labs & imaging results that were available during my care of the patient were reviewed by me and considered in my medical decision making (see chart for details).   Right otitis externa.  Treating with ofloxacin eardrops.  Education provided on otitis externa.  Instructed patient to follow up with her PCP if her symptoms are not improving.  She agrees to plan of care.     Final Clinical Impressions(s) / UC Diagnoses   Final diagnoses:  Acute otitis externa of right ear, unspecified type     Discharge Instructions      Use the ear drops as directed.  Follow up with your primary care provider if your symptoms are not improving.        ED Prescriptions     Medication Sig Dispense Auth. Provider   ofloxacin (FLOXIN) 0.3 % OTIC solution Place 10 drops into the right ear daily. 5 mL Sharion Balloon, NP      PDMP not reviewed this encounter.   Sharion Balloon, NP 09/02/22 1025

## 2022-10-02 ENCOUNTER — Encounter: Payer: Self-pay | Admitting: Obstetrics and Gynecology

## 2022-10-02 ENCOUNTER — Ambulatory Visit (INDEPENDENT_AMBULATORY_CARE_PROVIDER_SITE_OTHER): Payer: 59 | Admitting: Obstetrics and Gynecology

## 2022-10-02 ENCOUNTER — Other Ambulatory Visit (HOSPITAL_COMMUNITY)
Admission: RE | Admit: 2022-10-02 | Discharge: 2022-10-02 | Disposition: A | Discharge status: Home / Self Care | Payer: 59 | Source: Ambulatory Visit

## 2022-10-02 ENCOUNTER — Other Ambulatory Visit (HOSPITAL_COMMUNITY)
Admission: RE | Admit: 2022-10-02 | Discharge: 2022-10-02 | Disposition: A | Discharge status: Home / Self Care | Payer: 59 | Source: Ambulatory Visit | Attending: Obstetrics and Gynecology | Admitting: Obstetrics and Gynecology

## 2022-10-02 VITALS — BP 127/83 | HR 67 | Wt 242.0 lb

## 2022-10-02 DIAGNOSIS — N921 Excessive and frequent menstruation with irregular cycle: Secondary | ICD-10-CM

## 2022-10-02 DIAGNOSIS — N809 Endometriosis, unspecified: Secondary | ICD-10-CM

## 2022-10-02 DIAGNOSIS — N946 Dysmenorrhea, unspecified: Secondary | ICD-10-CM | POA: Diagnosis not present

## 2022-10-02 DIAGNOSIS — D219 Benign neoplasm of connective and other soft tissue, unspecified: Secondary | ICD-10-CM

## 2022-10-02 DIAGNOSIS — N939 Abnormal uterine and vaginal bleeding, unspecified: Secondary | ICD-10-CM

## 2022-10-02 DIAGNOSIS — R790 Abnormal level of blood mineral: Secondary | ICD-10-CM

## 2022-10-02 NOTE — Procedures (Signed)
Endometrial Biopsy Procedure Note  Pre-operative Diagnosis: AUB. BMI 40. HTN. DM2  Post-operative Diagnosis: same  Procedure Details  Cervical exam performed in the presence of a chaperone Urine pregnancy test was not done.  The risks (including infection, bleeding, pain, and uterine perforation) and benefits of the procedure were explained to the patient and Written informed consent was obtained.  The patient was placed in the dorsal lithotomy position.  Bimanual exam showed the uterus to be in the neutral position.  A Graves' speculum inserted in the vagina, and the cervix was visualized. The cervix was then prepped with povidone iodine.  A pipelle was inserted into the uterine cavity and sounded the uterus to a depth of 10cm.  A Moderate amount of tissue was collected after 1 passes. The sample was sent for pathologic examination.  Condition: Stable  Complications: None  Plan: The patient was advised to call for any fever or for prolonged or severe pain or bleeding. She was advised to use OTC analgesics as needed for mild to moderate pain. She was advised to avoid vaginal intercourse for 48 hours or until the bleeding has completely stopped.  Durene Romans MD Attending Center for [employer] farm manager)

## 2022-10-02 NOTE — Progress Notes (Unsigned)
RGYN pt here for F/U  States improvement yet still heavy. Periods are still irregular. And currently still taking Orlissa .  CC: Possible BV

## 2022-10-03 ENCOUNTER — Encounter: Payer: Self-pay | Admitting: Obstetrics and Gynecology

## 2022-10-03 DIAGNOSIS — N809 Endometriosis, unspecified: Secondary | ICD-10-CM | POA: Insufficient documentation

## 2022-10-03 DIAGNOSIS — D219 Benign neoplasm of connective and other soft tissue, unspecified: Secondary | ICD-10-CM | POA: Insufficient documentation

## 2022-10-03 DIAGNOSIS — N946 Dysmenorrhea, unspecified: Secondary | ICD-10-CM | POA: Insufficient documentation

## 2022-10-03 DIAGNOSIS — R790 Abnormal level of blood mineral: Secondary | ICD-10-CM | POA: Insufficient documentation

## 2022-10-03 HISTORY — PX: ENDOMETRIAL BIOPSY: PRO73

## 2022-10-03 NOTE — Progress Notes (Signed)
Obstetrics and Gynecology New Patient Evaluation  Appointment Date: 10/02/2022  OBGYN Clinic: [facility]  Primary Care Provider: Ronnell Freshwater  Referring Provider: Ronnell Freshwater, NP  Chief Complaint:  Chief Complaint  Patient presents with   Menstrual Problem    History of Present Illness: Allison Rose is a 42 y.o. African-American G3P2(Patient's last menstrual period was 08/02/2022.), seen for the above chief complaint. Her past medical history is significant for h/o BTL, endo, AUB/irregular periods, fibroids, dysmenorrhea and menorrhagia, DM2, potential sleep apnea, HTN, BMI 40  Patient first seen by Dr. Sabra Heck on 03/20/22 for irregular bleeding, painful/heavy periods. She was switched from combined OCPs, which were started by her PCP, to Micronor due to being hypertensive. She had a negative exam and bloodwork and u/s ordered.  Patient then had a follow up visit with Dr. Ernestina Patches on 11/13. Her u/s showed overall length of 10cm with a  few 1.5-3cm IM fibroids noted with thin ES of 61mm.  POPs without improvement and pt put on trial of Orilissa and f/u in 37m.  Patient states that prior to the [place] she had irregular bleeding and periods and the [place] has made her periods qmonth and regular but they are still heavy and painful.   Review of Systems: Pertinent items noted in HPI and remainder of comprehensive ROS otherwise negative.    Patient Active Problem List   Diagnosis Date Noted   Fibroids 10/03/2022   Endometriosis 10/03/2022   Dysmenorrhea 10/03/2022   Loud snoring 02/27/2022   Chronic allergic rhinitis 02/27/2022   Menorrhagia with irregular cycle 10/31/2021   Depression, major, single episode, moderate ([place]) 10/31/2021   Lumbar disc disease with radiculopathy 08/01/2021   Scoliosis of lumbosacral spine 08/01/2021   Knock knee 08/01/2021   Neoplasm of uncertain behavior of skin of foot 08/01/2021   Vitamin D deficiency 08/01/2021    Type 2 diabetes mellitus with polyneuropathy ([place]) 07/24/2021   Peptic ulcer 07/24/2021   Body mass index (BMI) of 40.1-44.9 in adult [facility]) 07/24/2021   Myopia, bilateral 03/25/2020   Essential hypertension 12/12/2017   Gastroesophageal reflux disease 12/12/2017   Hemorrhoids 10/18/2017    Past Medical History:  Past Medical History:  Diagnosis Date   Allergies    Chronic allergic rhinitis    Cyst of Bartholin's gland    Daytime sleepiness    DM (diabetes mellitus), type 2 (HCC)    GERD (gastroesophageal reflux disease)    Hemorrhoids    History of depression    Hypertension    Knock knee    Lumbar disc disease    scoliosis   Menorrhagia    Neuropathy    Peptic ulcer    Snoring    Vitamin B deficiency    Vitamin D deficiency     Past Surgical History:  Past Surgical History:  Procedure Laterality Date   DIAGNOSTIC LAPAROSCOPY  2004   diagnosted with endometriosis   GALLBLADDER SURGERY N/A 2016   SALPINGECTOMY Bilateral    had pain with Filshie clip so tubes were removed   TONSILLECTOMY N/A 2016   TUBAL LIGATION N/A 2011    Past Obstetrical History:  OB History  Gravida Para Term Preterm AB Living  3 2 2   1     SAB IAB Ectopic Multiple Live Births  1            # Outcome Date GA Lbr Len/2nd Weight Sex Delivery Anes PTL Lv  3 Term 2010 [redacted]w[redacted]d  Vag-Spont     2 Term 2006 [redacted]w[redacted]d    Vag-Vacuum     1 SAB             Past Gynecological History: As per HPI. History of Pap Smear(s): Yes.   Last pap 9/20223, which was negative and hpv negative  Social History:  Social History   Socioeconomic History   Marital status: Legally Separated    Spouse name: Not on file   Number of children: Not on file   Years of education: Not on file   Highest education level: Not on file  Occupational History   Not on file  Tobacco Use   Smoking status: Never   Smokeless tobacco: Never  Vaping Use   Vaping Use: Never used  Substance and Sexual Activity   Alcohol use: Not  Currently   Drug use: Yes    Types: Marijuana   Sexual activity: Not Currently  Other Topics Concern   Not on file  Social History Narrative   Caffeine no coffee, Ginger Beer.    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    Social Determinants of Health   Financial Resource Strain: Not on file  Food Insecurity: Not on file  Transportation Needs: Not on file  Physical Activity: Not on file  Stress: Not on file  Social Connections: Not on file  Intimate Partner Violence: Not on file    Family History:  Family History  Problem Relation Age of Onset   Alcoholism Mother    Depression Mother    Depression Father    Alcoholism Father    Depression Sister    Bipolar disorder Sister    Depression Maternal Grandmother    Diabetes Maternal Grandmother    Breast cancer Maternal Grandmother        in her 31's   Diabetes Maternal Grandfather    Depression Maternal Grandfather    Colon cancer Maternal Grandfather    Colon cancer Paternal Grandfather     Medications Allison Rose had no medications administered during this visit. Current Outpatient Medications  Medication Sig Dispense Refill   cetirizine (ZYRTEC) 10 MG tablet Take 1 tablet (10 mg total) by mouth daily. 90 tablet 1   Elagolix Sodium (ORILISSA) 150 MG TABS Take 150 mg by mouth daily. 30 tablet 11   ofloxacin (FLOXIN) 0.3 % OTIC solution Place 10 drops into the right ear daily. 5 mL 0   ondansetron (ZOFRAN-ODT) 4 MG disintegrating tablet Take 1 tablet (4 mg total) by mouth every 6 (six) hours as needed for nausea. 30 tablet 3   pantoprazole (PROTONIX) 40 MG tablet TAKE 1 TABLET(40 MG) BY MOUTH DAILY 90 tablet 1   No current facility-administered medications for this visit.    Allergies Metformin   Physical Exam:  BP 127/83   Pulse 67   Wt 242 lb (109.8 kg)   LMP 08/02/2022   BMI 39.66 kg/m  Body mass index is 39.66 kg/m. General appearance: Well nourished, well developed female in no  acute distress.  Cardiovascular: normal s1 and s2.  No murmurs, rubs or gallops. Respiratory:  Clear to auscultation bilateral. Normal respiratory effort Abdomen: positive bowel sounds and no masses, hernias; diffusely non tender to palpation, non distended Neuro/Psych:  Normal mood and affect.  Skin:  Warm and dry.  Lymphatic:  No inguinal lymphadenopathy.   Cervical exam performed in the presence of a chaperone Pelvic exam: is limited by body habitus EGBUS: within normal limits  Vagina: within normal limits and with no blood or discharge in the vault Cervix: normal appearing cervix without tenderness, discharge or lesions. Uterus:  nonenlarged and non tender Adnexa:  normal adnexa and no mass, fullness, tenderness Rectovaginal: deferred  See procedure note for uncomplicated endometrial biopsy  Laboratory: no new labs Negative TFTs May 2023 and normal CBC and [facility] 03/2022 with low ferritin   Radiology: no new imaging Images reviewed Narrative & Impression  CLINICAL DATA:  Menorrhagia, irregular bleeding.   EXAM: TRANSABDOMINAL AND TRANSVAGINAL ULTRASOUND OF PELVIS   TECHNIQUE: Both transabdominal and transvaginal ultrasound examinations of the pelvis were performed. Transabdominal technique was performed for global imaging of the pelvis including uterus, ovaries, adnexal regions, and pelvic cul-de-sac. It was necessary to proceed with endovaginal exam following the transabdominal exam to visualize the ovaries and endometrium.   COMPARISON:  None Available.   FINDINGS: Uterus   Measurements: 9.9 x 8.0 x 6.2 cm = volume: 253 mL. Three uterine fibroids are identified. Anterior left body intramural fibroid measures 2.9 by 3.0 x 2.6 cm. Fundal intramural/subserosal measures 1.8 x 1.5 x 2.3 cm. Left anterior fundal intramural/subserosal measures 3.2 x 2.8 x 2.3 cm.   Endometrium   Thickness: 9 mm.  No focal abnormality visualized.   Right ovary   Measurements: 2.5 x 1.9  x 2.2 cm = volume: 5.4 mL. Normal appearance/no adnexal mass. This is only seen trans abdominally.   Left ovary   Not visualized.   Other findings   Trace free fluid in the pelvis.   IMPRESSION: 1. The endometrium appears within normal limits and measures 9 mm. If bleeding remains unresponsive to hormonal or medical therapy, sonohysterogram should be considered for focal lesion work-up. (Ref: Radiological Reasoning: Algorithmic Workup of Abnormal Vaginal Bleeding with Endovaginal Sonography and Sonohysterography. AJR 2008; [id number]) 2. Uterine fibroids. 3. Trace free fluid in the pelvis. 4. Left ovary not visualized.     Electronically Signed   By: Ronney Asters M.D.   On: 03/27/2022 00:33    Assessment: patient stable  Plan:  1. Abnormal uterine bleeding (AUB) Options d/w patient and she has used combined OCPs, Micronor and depo provera with no help. She has not tried [place] and I told her that I'm optimistic that it can be helpful but that she would have to give it at least 63m, ideally 21m, to see if it's starting to work. Patient is not interested in surgery and is amenable to plan Follow up EMbx and plan for Mirena IUD placement in 2 weeks  - Cervicovaginal ancillary only( [place]) - Surgical pathology( [place])  2. Dysmenorrhea  3. Menorrhagia with irregular cycle  4. Fibroids See above. I told her that I don't feel that her fibroids are causing her s/s given their small size and location  5. Endometriosis See above. D/w her that Mirena can be beneficial for endo s/s, as well.    Return in about 2 weeks (around 10/16/2022) for in person, with dr Ilda Basset.  No future appointments.  Durene Romans MD Attending Center for [employer] farm manager)

## 2022-10-04 LAB — CERVICOVAGINAL ANCILLARY ONLY
Bacterial Vaginitis (gardnerella): POSITIVE — AB
Candida Glabrata: NEGATIVE
Candida Vaginitis: NEGATIVE
Chlamydia: NEGATIVE
Comment: NEGATIVE
Comment: NEGATIVE
Comment: NEGATIVE
Comment: NEGATIVE
Comment: NEGATIVE
Comment: NORMAL
Neisseria Gonorrhea: NEGATIVE
Trichomonas: NEGATIVE

## 2022-10-04 LAB — SURGICAL PATHOLOGY

## 2022-10-05 MED ORDER — METRONIDAZOLE 500 MG PO TABS: 500.0000 mg | ORAL_TABLET | Freq: Two times a day (BID) | ORAL | 0 refills | Status: DC

## 2022-10-05 NOTE — Addendum Note (Signed)
Addended by: Aletha Halim on: 10/05/2022 12:59 PM   Modules accepted: Orders

## 2022-11-22 ENCOUNTER — Ambulatory Visit (INDEPENDENT_AMBULATORY_CARE_PROVIDER_SITE_OTHER): Payer: 59 | Admitting: Obstetrics and Gynecology

## 2022-11-22 ENCOUNTER — Encounter: Payer: Self-pay | Admitting: Obstetrics and Gynecology

## 2022-11-22 VITALS — BP 141/94 | HR 60 | Wt 236.0 lb

## 2022-11-22 DIAGNOSIS — Z30430 Encounter for insertion of intrauterine contraceptive device: Secondary | ICD-10-CM

## 2022-11-22 HISTORY — PX: IUD INSERTION: OBO1003

## 2022-11-22 MED ORDER — LEVONORGESTREL 20 MCG/DAY IU IUD
1.0000 | INTRAUTERINE_SYSTEM | Freq: Once | INTRAUTERINE | Status: AC
  Administered 2022-11-22: 1 via INTRAUTERINE

## 2022-11-22 NOTE — Addendum Note (Signed)
Addended by: Kennon Portela on: 11/22/2022 04:26 PM   Modules accepted: Orders

## 2022-11-22 NOTE — Procedures (Signed)
Intrauterine Device (IUD) Insertion Procedure Note  A bimanual exam showed the uterus to be midposition.  Next, the cervix and vagina were cleaned with an antiseptic solution, and the cervix was grasped with a tenaculum.  The uterus was sounded to 10 cm.  The Mirena was placed without difficulty in the usual fashion.  The strings were cut to 3-4 cm.  The tenaculum was removed and cervix was found to be hemostatic.    No complications, patient tolerated the procedure well.  Cornelia Copa MD Attending Center for [employer] [facility])

## 2022-11-22 NOTE — Progress Notes (Signed)
RGYN pt presents for IUD insertion.  Pt has questions/concerns regarding fibroids and IUD.

## 2022-12-13 ENCOUNTER — Encounter: Payer: Self-pay | Admitting: Nurse Practitioner

## 2022-12-13 ENCOUNTER — Telehealth (INDEPENDENT_AMBULATORY_CARE_PROVIDER_SITE_OTHER): Payer: 59 | Admitting: Family Medicine

## 2022-12-13 ENCOUNTER — Ambulatory Visit (INDEPENDENT_AMBULATORY_CARE_PROVIDER_SITE_OTHER): Payer: 59 | Admitting: Nurse Practitioner

## 2022-12-13 VITALS — BP 126/86 | HR 90 | Ht 65.5 in | Wt 237.8 lb

## 2022-12-13 DIAGNOSIS — E1142 Type 2 diabetes mellitus with diabetic polyneuropathy: Secondary | ICD-10-CM

## 2022-12-13 DIAGNOSIS — K279 Peptic ulcer, site unspecified, unspecified as acute or chronic, without hemorrhage or perforation: Secondary | ICD-10-CM

## 2022-12-13 DIAGNOSIS — J309 Allergic rhinitis, unspecified: Secondary | ICD-10-CM | POA: Diagnosis not present

## 2022-12-13 DIAGNOSIS — N921 Excessive and frequent menstruation with irregular cycle: Secondary | ICD-10-CM | POA: Diagnosis not present

## 2022-12-13 DIAGNOSIS — I10 Essential (primary) hypertension: Secondary | ICD-10-CM | POA: Diagnosis not present

## 2022-12-13 DIAGNOSIS — Z975 Presence of (intrauterine) contraceptive device: Secondary | ICD-10-CM

## 2022-12-13 DIAGNOSIS — F321 Major depressive disorder, single episode, moderate: Secondary | ICD-10-CM

## 2022-12-13 MED ORDER — HYDROCHLOROTHIAZIDE 12.5 MG PO TABS: 12.5000 mg | ORAL_TABLET | Freq: Every day | ORAL | 1 refills | Status: AC

## 2022-12-13 MED ORDER — PANTOPRAZOLE SODIUM 40 MG PO TBEC: DELAYED_RELEASE_TABLET | ORAL | 1 refills | Status: AC

## 2022-12-13 MED ORDER — CETIRIZINE HCL 10 MG PO TABS: 10.0000 mg | ORAL_TABLET | Freq: Every day | ORAL | 1 refills | Status: AC

## 2022-12-13 NOTE — Progress Notes (Signed)
CC: heavy bleeding with clots and cramping since IUD placement ., on 11/22/22- Mirena

## 2022-12-13 NOTE — Progress Notes (Signed)
Established patient visit   Patient: Allison Rose   DOB: March 08, 1981   42 y.o. Female  MRN: [id number] Visit Date: 12/13/2022  Chief Complaint  Patient presents with   Headache   Subjective    The patient has history of hypertension which she managed to control up until now.  -ankles swelling'-head hurting, throbbing -sweating   Headache  This is a new problem. The current episode started more than 1 month ago. The problem occurs constantly. The problem has been waxing and waning. The pain is located in the Vertex and bilateral region. The pain does not radiate. The pain quality is similar to prior headaches. The quality of the pain is described as throbbing and aching. Associated symptoms include blurred vision, scalp tenderness and a visual change. Pertinent negatives include no abdominal pain, abnormal behavior, anorexia, back pain, coughing, dizziness, drainage, ear pain, eye pain, eye redness, eye watering, facial sweating, hearing loss, insomnia, loss of balance, muscle aches, nausea, neck pain, numbness, phonophobia, photophobia, rhinorrhea, seizures, sinus pressure, sore throat, swollen glands, tinnitus, vomiting, weakness or weight loss. She has tried acetaminophen for the symptoms. The treatment provided no relief. Her past medical history is significant for hypertension, migraine headaches and obesity.      Medications: Outpatient Medications Prior to Visit  Medication Sig   [DISCONTINUED] cetirizine (ZYRTEC) 10 MG tablet Take 1 tablet (10 mg total) by mouth daily.   [DISCONTINUED] pantoprazole (PROTONIX) 40 MG tablet TAKE 1 TABLET(40 MG) BY MOUTH DAILY   [DISCONTINUED] metroNIDAZOLE (FLAGYL) 500 MG tablet Take 1 tablet (500 mg total) by mouth 2 (two) times daily. (Patient not taking: Reported on 11/22/2022)   [DISCONTINUED] ofloxacin (FLOXIN) 0.3 % OTIC solution Place 10 drops into the right ear daily.   [DISCONTINUED] ondansetron (ZOFRAN-ODT) 4 MG disintegrating tablet Take 1  tablet (4 mg total) by mouth every 6 (six) hours as needed for nausea.   No facility-administered medications prior to visit.    Review of Systems  Constitutional:  Negative for weight loss.  HENT:  Negative for ear pain, hearing loss, rhinorrhea, sinus pressure, sore throat and tinnitus.   Eyes:  Positive for blurred vision. Negative for photophobia, pain and redness.  Respiratory:  Negative for cough.   Gastrointestinal:  Negative for abdominal pain, anorexia, nausea and vomiting.  Musculoskeletal:  Negative for back pain and neck pain.  Neurological:  Positive for headaches. Negative for dizziness, seizures, weakness, numbness and loss of balance.  Psychiatric/Behavioral:  The patient does not have insomnia.      Objective     Today's Vitals   12/13/22 1119 12/13/22 1129  BP: (Abnormal) 145/99 126/86  Pulse: 90   SpO2: 100%   Weight: 237 lb 12.8 oz (107.9 kg)   Height: 5' 5.5" (1.664 m)    Body mass index is 38.97 kg/m.   Physical Exam Vitals and nursing note reviewed.  Constitutional:      Appearance: Normal appearance. She is well-developed. She is ill-appearing.  HENT:     Head: Normocephalic and atraumatic.     Nose: Nose normal.     Mouth/Throat:     Mouth: Mucous membranes are moist.     Pharynx: Oropharynx is clear.  Eyes:     Extraocular Movements: Extraocular movements intact.     Conjunctiva/sclera: Conjunctivae normal.     Pupils: Pupils are equal, round, and reactive to light.  Neck:     Vascular: No carotid bruit.  Cardiovascular:     Rate and Rhythm: Normal rate and  regular rhythm.     Pulses: Normal pulses.     Heart sounds: Normal heart sounds.  Pulmonary:     Effort: Pulmonary effort is normal.     Breath sounds: Normal breath sounds.  Abdominal:     Palpations: Abdomen is soft.  Musculoskeletal:        General: Normal range of motion.     Cervical back: Normal range of motion and neck supple.  Lymphadenopathy:     Cervical: No cervical  adenopathy.  Skin:    General: Skin is warm and dry.     Capillary Refill: Capillary refill takes less than 2 seconds.  Neurological:     General: No focal deficit present.     Mental Status: She is alert and oriented to person, place, and time.  Psychiatric:        Mood and Affect: Mood normal.        Behavior: Behavior normal.        Thought Content: Thought content normal.        Judgment: Judgment normal.      Assessment & Plan     Essential hypertension Assessment & Plan: Likely cause of patient's new onset of headache. -Start HCTZ 12.5 mg daily. -Recommend heart healthy diet and increase water intake. -Reassess in 3 weeks.  Orders: -     hydroCHLOROthiazide; Take 1 tablet (12.5 mg total) by mouth daily.  Dispense: 90 tablet; Refill: 1  Chronic allergic rhinitis Assessment & Plan: Continue Zyrtec as prescribed.   Orders: -     Cetirizine HCl; Take 1 tablet (10 mg total) by mouth daily.  Dispense: 90 tablet; Refill: 1  Peptic ulcer Assessment & Plan: Continue pantoprazole as previously prescribed. -Avoid trigger foods. -Avoid eating 2 hours prior to bedtime.  Recommend sleeping with head of bed raised at 30 degrees.  Orders: -     Pantoprazole Sodium; TAKE 1 TABLET(40 MG) BY MOUTH DAILY  Dispense: 90 tablet; Refill: 1  Type 2 diabetes mellitus with polyneuropathy (HCC) Assessment & Plan: Currently controlled through diet. -Check hemoglobin A1c prior to next visit and treat as indicated.   Severe obesity (BMI >= 40) (HCC) Assessment & Plan: Discussed lowering calorie intake to 1500 calories per day and incorporating exercise into daily routine to help lose weight.  -Discussed additional options to help with weight loss at next visit in 3 weeks.      Return in about 3 weeks (around 01/03/2023) for blood pressure, headache, FBW at time of visit.        Carlean Jews, NP  [facility] at [facility] [phone] (phone) [phone]  (fax)  [facility]

## 2022-12-13 NOTE — Progress Notes (Signed)
GYNECOLOGY VIRTUAL VISIT ENCOUNTER NOTE  Provider location: [facility] for [facility]   Patient location: Home  I connected with Allison Rose on 12/13/22 at  3:50 PM EDT by MyChart Video Encounter and verified that I am speaking with the correct person using two identifiers.   I discussed the limitations, risks, security and privacy concerns of performing an evaluation and management service virtually and the availability of in person appointments. I also discussed with the patient that there may be a patient responsible charge related to this service. The patient expressed understanding and agreed to proceed.   History:  Allison Rose is a 42 y.o. G85P2010 female being evaluated today for heavy vaginal bleeding since IUD insertion. She has h/o BTL. She is having a lot of cramping. She denies any abnormal vaginal discharge or other concerns.       Past Medical History:  Diagnosis Date   Allergies    Chronic allergic rhinitis    Cyst of Bartholin's gland    Daytime sleepiness    DM (diabetes mellitus), type 2 (HCC)    GERD (gastroesophageal reflux disease)    Hemorrhoids    History of depression    Hypertension    Knock knee    Lumbar disc disease    scoliosis   Menorrhagia    Neuropathy    Peptic ulcer    Snoring    Vitamin B deficiency    Vitamin D deficiency    Past Surgical History:  Procedure Laterality Date   DIAGNOSTIC LAPAROSCOPY  2004   diagnosted with endometriosis   ENDOMETRIAL BIOPSY  10/03/2022   GALLBLADDER SURGERY N/A 2016   IUD INSERTION  11/22/2022   SALPINGECTOMY Bilateral    had pain with Filshie clip so tubes were removed   TONSILLECTOMY N/A 2016   TUBAL LIGATION N/A 2011   The following portions of the patient's history were reviewed and updated as appropriate: allergies, current medications, past family history, past medical history, past social history, past surgical history and problem list.   Health Maintenance:  Normal pap  and negative HRHPV on 03/20/2022.    Review of Systems:  Pertinent items noted in HPI and remainder of comprehensive ROS otherwise negative.  Physical Exam:   General:  Alert, oriented and cooperative. Patient appears to be in no acute distress.  Mental Status: Normal mood and affect. Normal behavior. Normal judgment and thought content.   Respiratory: Normal respiratory effort, no problems with respiration noted  Rest of physical exam deferred due to type of encounter  Labs and Imaging No results found for this or any previous visit (from the past 336 hour(s)). No results found.     Assessment and Plan:     Breakthrough bleeding associated with intrauterine device (IUD) - suspect related to recent insertion. Offered trial of POPs or NSAIDs. She prefers to monitor. Reviewed normal length of abnl bleeding with IUDs. NSAIDS for pain       I discussed the assessment and treatment plan with the patient. The patient was provided an opportunity to ask questions and all were answered. The patient agreed with the plan and demonstrated an understanding of the instructions.   The patient was advised to call back or seek an in-person evaluation/go to the ED if the symptoms worsen or if the condition fails to improve as anticipated.  I provided 8 minutes of face-to-face time during this encounter.   Reva Bores, MD [facility] for [employer], [facility]

## 2023-01-01 NOTE — Assessment & Plan Note (Signed)
Likely cause of patient's new onset of headache. -Start HCTZ 12.5 mg daily. -Recommend heart healthy diet and increase water intake. -Reassess in 3 weeks.

## 2023-01-01 NOTE — Assessment & Plan Note (Signed)
Discussed lowering calorie intake to 1500 calories per day and incorporating exercise into daily routine to help lose weight.  -Discussed additional options to help with weight loss at next visit in 3 weeks.

## 2023-01-01 NOTE — Assessment & Plan Note (Signed)
Continue Zyrtec as prescribed

## 2023-01-01 NOTE — Assessment & Plan Note (Signed)
Continue pantoprazole as previously prescribed. -Avoid trigger foods. -Avoid eating 2 hours prior to bedtime.  Recommend sleeping with head of bed raised at 30 degrees.

## 2023-01-01 NOTE — Assessment & Plan Note (Signed)
Currently controlled through diet. -Check hemoglobin A1c prior to next visit and treat as indicated.

## 2023-01-19 ENCOUNTER — Ambulatory Visit (INDEPENDENT_AMBULATORY_CARE_PROVIDER_SITE_OTHER): Payer: Medicaid Other

## 2023-01-19 ENCOUNTER — Other Ambulatory Visit (HOSPITAL_COMMUNITY)
Admission: RE | Admit: 2023-01-19 | Discharge: 2023-01-19 | Disposition: A | Discharge status: Home / Self Care | Payer: Medicaid Other | Source: Ambulatory Visit | Attending: Family Medicine | Admitting: Family Medicine

## 2023-01-19 VITALS — BP 130/85 | HR 72

## 2023-01-19 DIAGNOSIS — N898 Other specified noninflammatory disorders of vagina: Secondary | ICD-10-CM

## 2023-01-19 NOTE — Progress Notes (Signed)
Attestation of Attending Supervision of clinical support staff: I agree with the care provided to this patient and was available for any consultation.  I have reviewed the RN's note and chart. I was available for consult and to see the patient if needed.   Kandise Riehle MD MPH Attending Physician [facility]- [facility]  

## 2023-01-19 NOTE — Progress Notes (Signed)
SUBJECTIVE:  42 y.o. female who desires a STI screen. vaginal discharge, Vaginal irritation   Is still bleeding from IUD placement   denies significant pelvic pain. No UTI symptoms. Denies history of known exposure to STD.  No LMP recorded. (Menstrual status: Irregular Periods).  OBJECTIVE:  She appears well.   ASSESSMENT:  STI Screen   PLAN:  Pt offered STI blood screening-not indicated GC, chlamydia, and trichomonas probe sent to lab.  Treatment: To be determined once lab results are received.  Pt follow up as needed.

## 2023-01-22 LAB — CERVICOVAGINAL ANCILLARY ONLY
Bacterial Vaginitis (gardnerella): POSITIVE — AB
Candida Glabrata: NEGATIVE
Candida Vaginitis: NEGATIVE
Chlamydia: NEGATIVE
Comment: NEGATIVE
Comment: NEGATIVE
Comment: NEGATIVE
Comment: NEGATIVE
Comment: NEGATIVE
Comment: NORMAL
Neisseria Gonorrhea: NEGATIVE
Trichomonas: NEGATIVE

## 2023-01-24 ENCOUNTER — Other Ambulatory Visit: Payer: Self-pay | Admitting: Family Medicine

## 2023-01-24 MED ORDER — METRONIDAZOLE 0.75 % VA GEL: 1.0000 | Freq: Every day | VAGINAL | 0 refills | Status: AC

## 2023-02-20 DIAGNOSIS — Z6837 Body mass index (BMI) 37.0-37.9, adult: Secondary | ICD-10-CM | POA: Diagnosis not present

## 2023-02-20 DIAGNOSIS — R5383 Other fatigue: Secondary | ICD-10-CM | POA: Diagnosis not present

## 2023-02-20 DIAGNOSIS — I10 Essential (primary) hypertension: Secondary | ICD-10-CM | POA: Diagnosis not present

## 2023-02-20 DIAGNOSIS — J00 Acute nasopharyngitis [common cold]: Secondary | ICD-10-CM | POA: Diagnosis not present

## 2023-11-23 IMAGING — DX DG BONE LENGTH
2 series · 9 of 10 positions shown · non-contrast
Comparison: None.

CLINICAL DATA: Chronic right knee pain

EXAM:
BONE LENGTH

[Series 1: long bone ap · 0.13mm/px · 5 of 5 slices shown (1 of 2)]
[im 1/5]
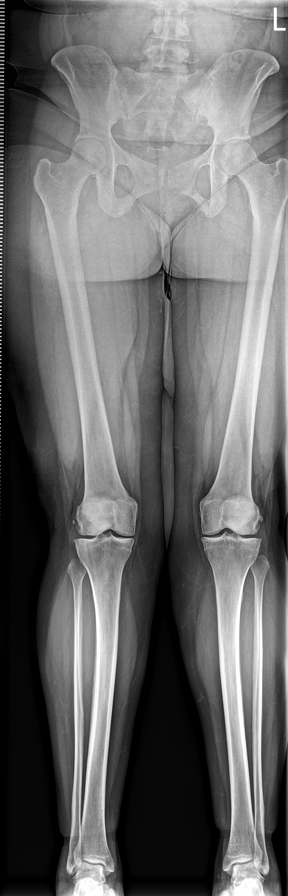
[im 2/5]
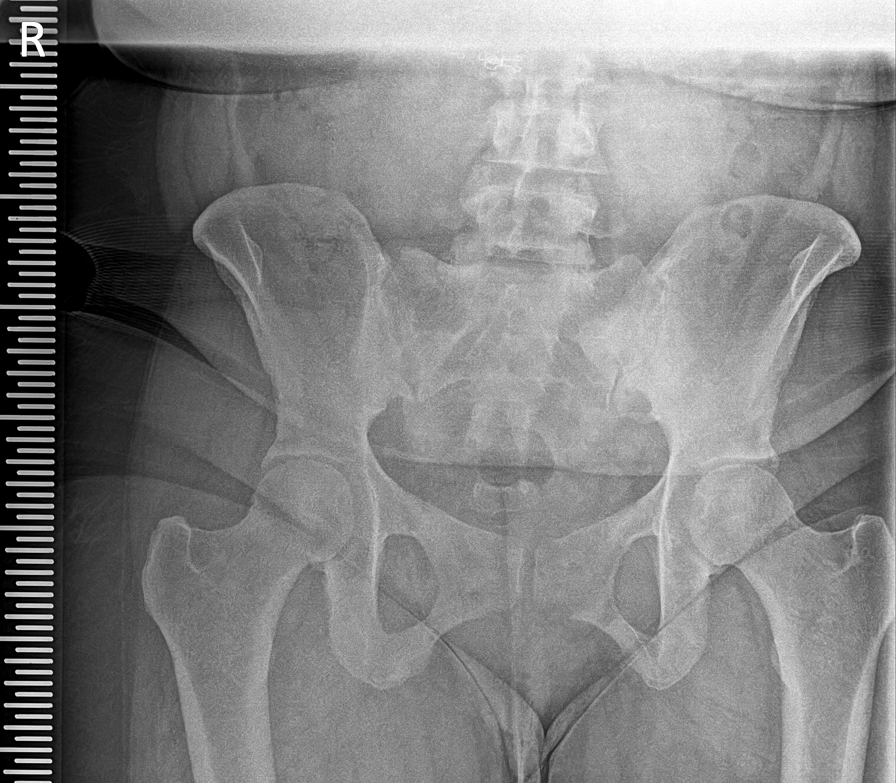
[im 3/5]
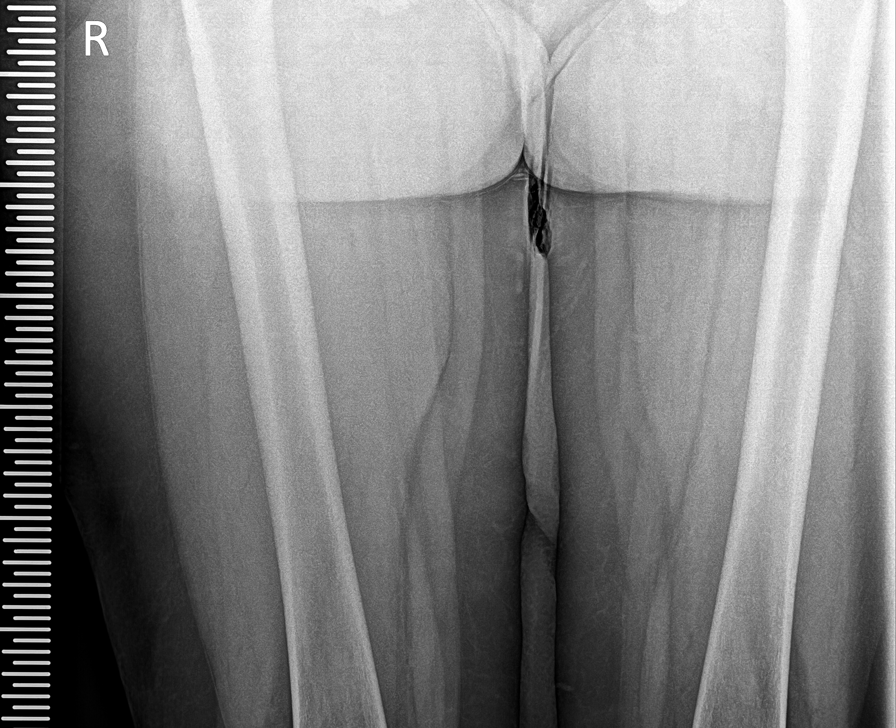
[im 4/5]
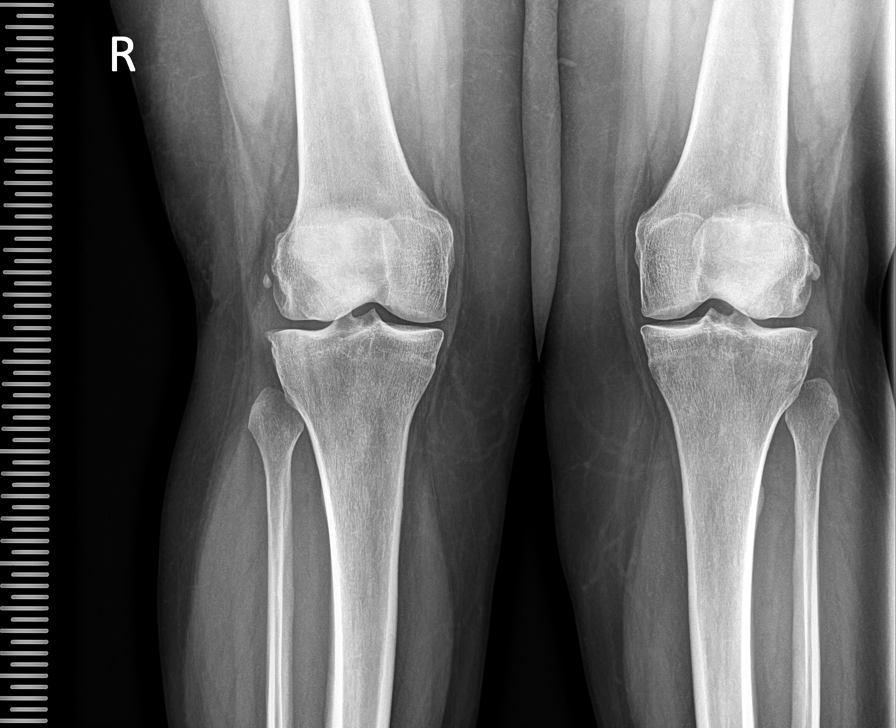
[im 5/5]
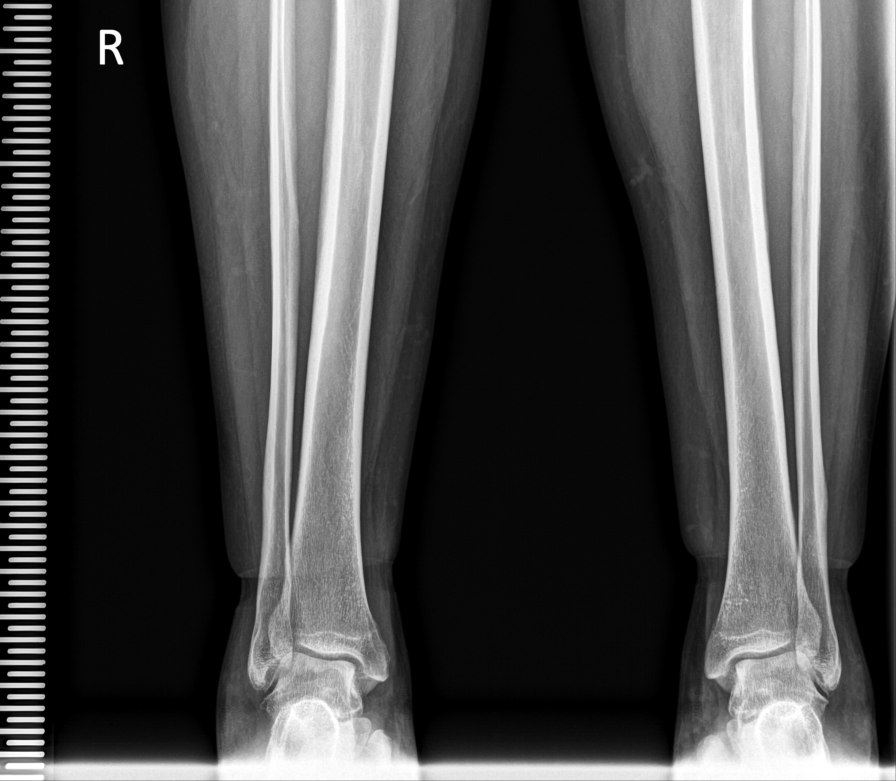

[Series 2: long bone ap · 0.13mm/px · 4 of 5 slices shown (2 of 2)]
[im 1/5]
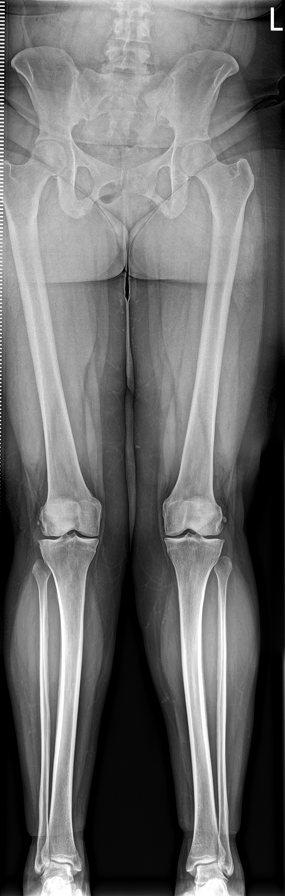
[im 2/5]
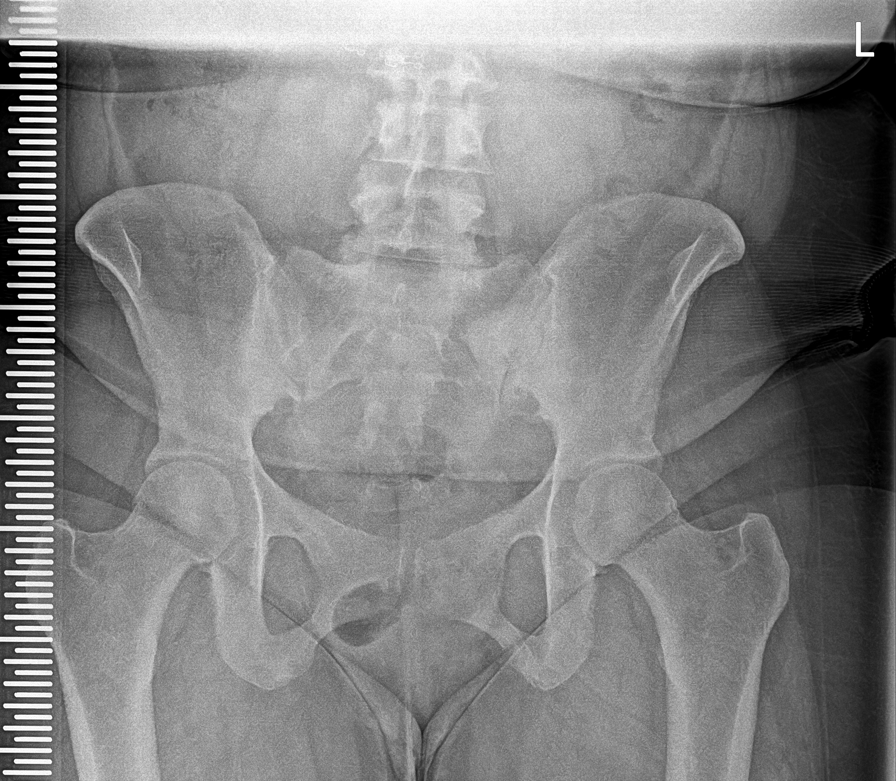
[im 3/5]
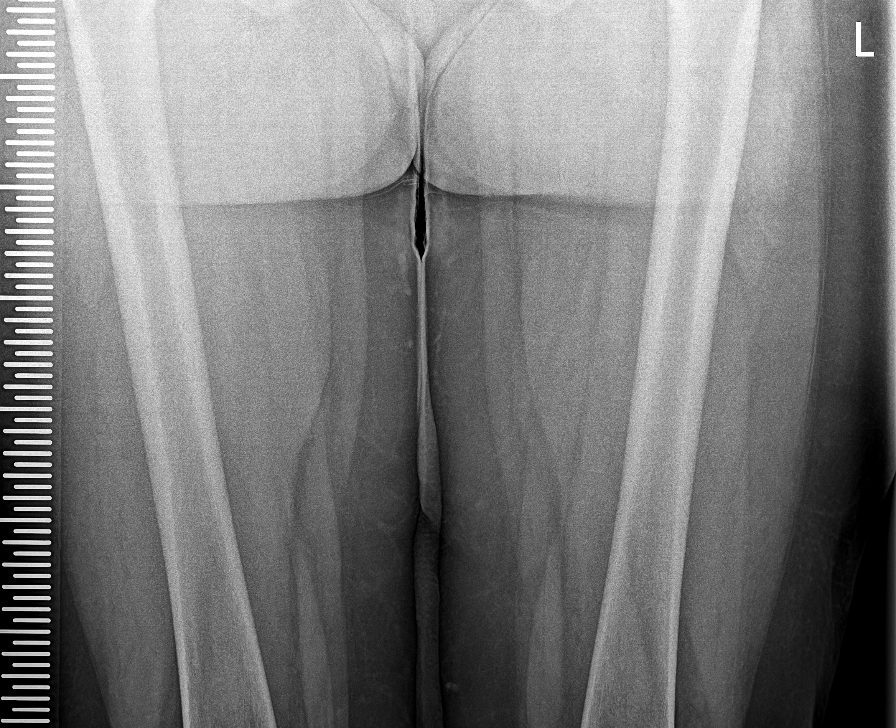
[im 4/5]
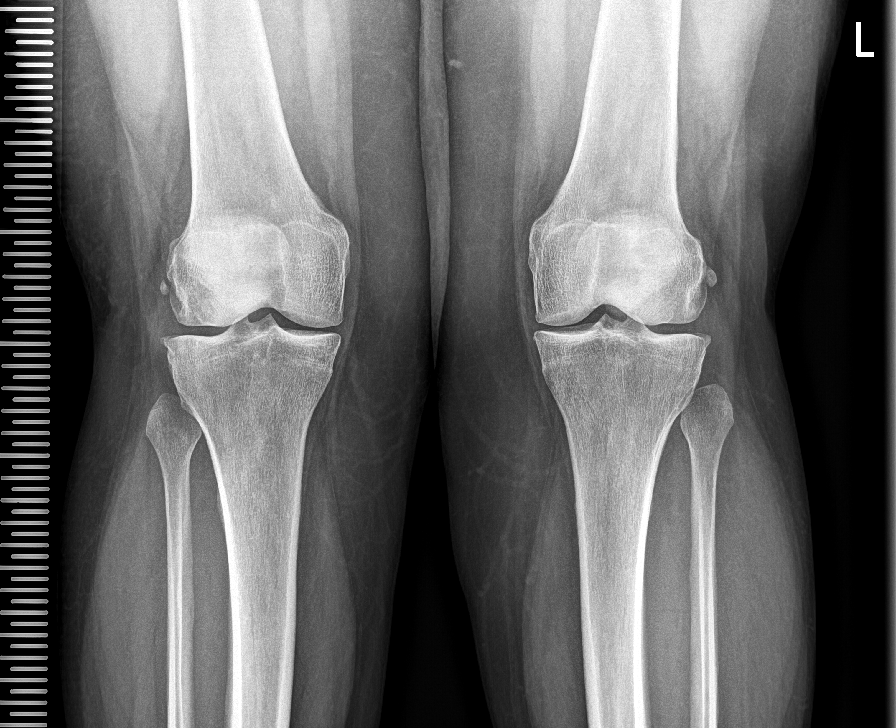

[9 of 10 positions shown; findings below may reference images not displayed]

FINDINGS: There is no evidence of acute fracture. There is early lateral
compartment degenerative change bilaterally. There is no focal bone
lesion identified. There is a slight left downward pelvic tilt
cm. There is slight lateral bowing of the tibias. There is slight
valgus angulation of the ankles. Center line axis from the femoral
head to the tibial plafond passes lateral to the tibial eminence
bilaterally, 1.6 cm on the right and 1.4 cm on the left.

The right femur measures 52.4 cm.

The right tibia measures 43.6 cm.

Total right lower extremity length is 96.0 cm.

The left femur measures 52.2 cm.

The left tibia measures 43.5 cm.

Total left lower extremity length is 95.7 cm.
IMPRESSION: No significant limb length discrepancy, measurements above.

Early lateral compartment degenerative change of the right knee.
